# Patient Record
Sex: Female | Born: 1975
Health system: Southern US, Community
[De-identification: ages and names within clinical notes are randomized; demographics above are authoritative.]

## PROBLEM LIST (undated history)

## (undated) ENCOUNTER — Inpatient Hospital Stay (HOSPITAL_COMMUNITY): Payer: Self-pay

## (undated) DIAGNOSIS — A609 Anogenital herpesviral infection, unspecified: Secondary | ICD-10-CM

## (undated) DIAGNOSIS — F32A Depression, unspecified: Secondary | ICD-10-CM

## (undated) DIAGNOSIS — F329 Major depressive disorder, single episode, unspecified: Secondary | ICD-10-CM

## (undated) DIAGNOSIS — R87629 Unspecified abnormal cytological findings in specimens from vagina: Secondary | ICD-10-CM

## (undated) DIAGNOSIS — O09529 Supervision of elderly multigravida, unspecified trimester: Secondary | ICD-10-CM

## (undated) DIAGNOSIS — K900 Celiac disease: Secondary | ICD-10-CM

## (undated) DIAGNOSIS — K589 Irritable bowel syndrome without diarrhea: Secondary | ICD-10-CM

## (undated) HISTORY — PX: MOUTH SURGERY: SHX715

## (undated) HISTORY — PX: ADENOIDECTOMY: SUR15

## (undated) HISTORY — PX: HAND SURGERY: SHX662

---

## 2001-01-16 ENCOUNTER — Observation Stay (HOSPITAL_COMMUNITY): Admission: AD | Admit: 2001-01-16 | Discharge: 2001-01-17 | Payer: Self-pay | Admitting: Family Medicine

## 2001-01-30 ENCOUNTER — Other Ambulatory Visit: Admission: RE | Admit: 2001-01-30 | Discharge: 2001-01-30 | Payer: Self-pay | Admitting: Family Medicine

## 2001-01-30 ENCOUNTER — Other Ambulatory Visit: Admission: RE | Admit: 2001-01-30 | Discharge: 2001-01-30 | Payer: Self-pay | Admitting: Obstetrics & Gynecology

## 2001-07-13 ENCOUNTER — Inpatient Hospital Stay (HOSPITAL_COMMUNITY): Admission: AD | Admit: 2001-07-13 | Discharge: 2001-07-13 | Payer: Self-pay | Admitting: Obstetrics & Gynecology

## 2001-07-23 ENCOUNTER — Inpatient Hospital Stay (HOSPITAL_COMMUNITY): Admission: AD | Admit: 2001-07-23 | Discharge: 2001-07-26 | Payer: Self-pay | Admitting: Obstetrics and Gynecology

## 2001-08-23 ENCOUNTER — Other Ambulatory Visit: Admission: RE | Admit: 2001-08-23 | Discharge: 2001-08-23 | Payer: Self-pay | Admitting: Obstetrics & Gynecology

## 2003-06-13 ENCOUNTER — Other Ambulatory Visit: Admission: RE | Admit: 2003-06-13 | Discharge: 2003-06-13 | Payer: Self-pay | Admitting: Obstetrics & Gynecology

## 2005-03-03 ENCOUNTER — Other Ambulatory Visit: Admission: RE | Admit: 2005-03-03 | Discharge: 2005-03-03 | Payer: Self-pay | Admitting: Obstetrics and Gynecology

## 2005-03-03 ENCOUNTER — Other Ambulatory Visit: Admission: RE | Admit: 2005-03-03 | Discharge: 2005-03-03 | Payer: Self-pay | Admitting: Obstetrics & Gynecology

## 2005-04-19 HISTORY — PX: OTHER SURGICAL HISTORY: SHX169

## 2006-01-01 ENCOUNTER — Emergency Department (HOSPITAL_COMMUNITY): Admission: EM | Admit: 2006-01-01 | Discharge: 2006-01-01 | Payer: Self-pay | Admitting: Emergency Medicine

## 2007-04-26 ENCOUNTER — Encounter (INDEPENDENT_AMBULATORY_CARE_PROVIDER_SITE_OTHER): Payer: Self-pay | Admitting: Obstetrics & Gynecology

## 2007-04-26 ENCOUNTER — Ambulatory Visit (HOSPITAL_COMMUNITY): Admission: RE | Admit: 2007-04-26 | Discharge: 2007-04-26 | Payer: Self-pay | Admitting: Obstetrics & Gynecology

## 2009-11-06 ENCOUNTER — Observation Stay (HOSPITAL_COMMUNITY): Admission: EM | Admit: 2009-11-06 | Discharge: 2009-11-10 | Payer: Self-pay | Admitting: Emergency Medicine

## 2010-02-08 ENCOUNTER — Emergency Department (HOSPITAL_COMMUNITY): Admission: EM | Admit: 2010-02-08 | Discharge: 2010-02-08 | Payer: Self-pay | Admitting: Emergency Medicine

## 2010-07-04 LAB — FECAL LACTOFERRIN, QUANT: Fecal Lactoferrin: POSITIVE

## 2010-07-04 LAB — DIFFERENTIAL
Basophils Absolute: 0 10*3/uL (ref 0.0–0.1)
Basophils Absolute: 0 10*3/uL (ref 0.0–0.1)
Basophils Relative: 0 % (ref 0–1)
Basophils Relative: 0 % (ref 0–1)
Eosinophils Absolute: 0.1 10*3/uL (ref 0.0–0.7)
Eosinophils Absolute: 0.1 10*3/uL (ref 0.0–0.7)
Eosinophils Absolute: 0.1 10*3/uL (ref 0.0–0.7)
Eosinophils Relative: 2 % (ref 0–5)
Eosinophils Relative: 2 % (ref 0–5)
Eosinophils Relative: 2 % (ref 0–5)
Eosinophils Relative: 3 % (ref 0–5)
Lymphocytes Relative: 39 % (ref 12–46)
Lymphocytes Relative: 56 % — ABNORMAL HIGH (ref 12–46)
Lymphs Abs: 1.5 10*3/uL (ref 0.7–4.0)
Lymphs Abs: 2.3 10*3/uL (ref 0.7–4.0)
Monocytes Absolute: 0.2 10*3/uL (ref 0.1–1.0)
Monocytes Absolute: 0.4 10*3/uL (ref 0.1–1.0)
Monocytes Relative: 10 % (ref 3–12)
Monocytes Relative: 6 % (ref 3–12)
Monocytes Relative: 7 % (ref 3–12)
Neutro Abs: 1.3 10*3/uL — ABNORMAL LOW (ref 1.7–7.7)
Neutro Abs: 2 10*3/uL (ref 1.7–7.7)
Neutro Abs: 3.2 10*3/uL (ref 1.7–7.7)
Neutrophils Relative %: 31 % — ABNORMAL LOW (ref 43–77)
Neutrophils Relative %: 41 % — ABNORMAL LOW (ref 43–77)
Neutrophils Relative %: 47 % (ref 43–77)
Neutrophils Relative %: 52 % (ref 43–77)

## 2010-07-04 LAB — URINALYSIS, ROUTINE W REFLEX MICROSCOPIC
Glucose, UA: NEGATIVE mg/dL
Nitrite: NEGATIVE
Protein, ur: NEGATIVE mg/dL
Urobilinogen, UA: 0.2 mg/dL (ref 0.0–1.0)

## 2010-07-04 LAB — BASIC METABOLIC PANEL
BUN: 1 mg/dL — ABNORMAL LOW (ref 6–23)
BUN: 1 mg/dL — ABNORMAL LOW (ref 6–23)
CO2: 25 mEq/L (ref 19–32)
CO2: 26 mEq/L (ref 19–32)
CO2: 27 mEq/L (ref 19–32)
CO2: 27 mEq/L (ref 19–32)
Calcium: 8 mg/dL — ABNORMAL LOW (ref 8.4–10.5)
Calcium: 8.4 mg/dL (ref 8.4–10.5)
Calcium: 8.5 mg/dL (ref 8.4–10.5)
Chloride: 105 mEq/L (ref 96–112)
Chloride: 105 mEq/L (ref 96–112)
Chloride: 111 mEq/L (ref 96–112)
Creatinine, Ser: 0.76 mg/dL (ref 0.4–1.2)
Creatinine, Ser: 0.82 mg/dL (ref 0.4–1.2)
Creatinine, Ser: 0.82 mg/dL (ref 0.4–1.2)
GFR calc Af Amer: >60 mL/min (ref >60)
GFR calc Af Amer: >60 mL/min (ref >60)
GFR calc non Af Amer: >60 mL/min (ref >60)
GFR calc non Af Amer: >60 mL/min (ref >60)
GFR calc non Af Amer: >60 mL/min (ref >60)
Glucose, Bld: 66 mg/dL — ABNORMAL LOW (ref 70–99)
Glucose, Bld: 93 mg/dL (ref 70–99)
Glucose, Bld: 95 mg/dL (ref 70–99)
Potassium: 3.8 mEq/L (ref 3.5–5.1)
Potassium: 4.1 mEq/L (ref 3.5–5.1)
Potassium: 4.2 mEq/L (ref 3.5–5.1)
Sodium: 136 mEq/L (ref 135–145)
Sodium: 137 mEq/L (ref 135–145)
Sodium: 139 mEq/L (ref 135–145)

## 2010-07-04 LAB — CBC
HCT: 31.4 % — ABNORMAL LOW (ref 36.0–46.0)
HCT: 33.1 % — ABNORMAL LOW (ref 36.0–46.0)
HCT: 36.3 % (ref 36.0–46.0)
HCT: 41.7 % (ref 36.0–46.0)
Hemoglobin: 11.4 g/dL — ABNORMAL LOW (ref 12.0–15.0)
Hemoglobin: 12.7 g/dL (ref 12.0–15.0)
MCH: 33 pg (ref 26.0–34.0)
MCH: 33.4 pg (ref 26.0–34.0)
MCH: 33.6 pg (ref 26.0–34.0)
MCH: 33.7 pg (ref 26.0–34.0)
MCHC: 34.3 g/dL (ref 30.0–36.0)
MCHC: 34.6 g/dL (ref 30.0–36.0)
MCHC: 34.9 g/dL (ref 30.0–36.0)
MCV: 96.5 fL (ref 78.0–100.0)
MCV: 96.5 fL (ref 78.0–100.0)
MCV: 97.3 fL (ref 78.0–100.0)
Platelets: 171 10*3/uL (ref 150–400)
Platelets: 177 10*3/uL (ref 150–400)
Platelets: 178 10*3/uL (ref 150–400)
Platelets: 249 10*3/uL (ref 150–400)
RBC: 3.4 MIL/uL — ABNORMAL LOW (ref 3.87–5.11)
RBC: 3.76 MIL/uL — ABNORMAL LOW (ref 3.87–5.11)
RDW: 12.9 % (ref 11.5–15.5)
RDW: 13.3 % (ref 11.5–15.5)
RDW: 13.3 % (ref 11.5–15.5)
WBC: 3.9 10*3/uL — ABNORMAL LOW (ref 4.0–10.5)
WBC: 4.2 10*3/uL (ref 4.0–10.5)
WBC: 5.6 10*3/uL (ref 4.0–10.5)
WBC: 6.7 10*3/uL (ref 4.0–10.5)

## 2010-07-04 LAB — IRON AND TIBC
Iron: 69 ug/dL (ref 42–135)
Saturation Ratios: 25 % (ref 20–55)
TIBC: 274 ug/dL (ref 250–470)
UIBC: 205 ug/dL

## 2010-07-04 LAB — OVA AND PARASITE EXAMINATION

## 2010-07-04 LAB — HEPATIC FUNCTION PANEL
ALT: 11 U/L (ref 0–35)
Alkaline Phosphatase: 54 U/L (ref 39–117)
Total Bilirubin: 0.7 mg/dL (ref 0.3–1.2)

## 2010-07-04 LAB — CLOSTRIDIUM DIFFICILE EIA: C difficile Toxins A+B, EIA: NEGATIVE

## 2010-07-04 LAB — VITAMIN B12: Vitamin B-12: 376 pg/mL (ref 211–911)

## 2010-07-04 LAB — HEMOCCULT GUIAC POC 1CARD (OFFICE)
Fecal Occult Bld: NEGATIVE
Fecal Occult Bld: NEGATIVE
Fecal Occult Bld: NEGATIVE

## 2010-07-04 LAB — LIPASE, BLOOD: Lipase: 29 U/L (ref 11–59)

## 2010-07-04 LAB — FOLATE RBC: RBC Folate: 654 ng/mL — ABNORMAL HIGH (ref 180–600)

## 2010-09-01 NOTE — Op Note (Signed)
Tina Ramirez, Tina Ramirez                 ACCOUNT NO.:  [id number]   MEDICAL RECORD NO.:  [id number]          PATIENT TYPE:  AMB   LOCATION:  SDC                           FACILITY:  [facility]   PHYSICIAN:  Freddy Finner, M.D.   DATE OF BIRTH:  Dec 05, 1975   DATE OF PROCEDURE:  04/26/2007  DATE OF DISCHARGE:                               OPERATIVE REPORT   PREOPERATIVE DIAGNOSIS:  First trimester intrauterine pregnancy with  demise/missed abortion.   POSTOPERATIVE DIAGNOSIS:  First trimester intrauterine pregnancy with  demise/missed abortion.   OPERATIVE PROCEDURE:  Dilatation and evacuation.   SURGEON:  Freddy Finner, M.D.   ANESTHESIA:  IV sedation and paracervical block.   ESTIMATED INTRAOPERATIVE BLOOD LOSS:  Less than or equal to 50 mL.   INTRAOPERATIVE COMPLICATIONS:  None.   The patient is a 35 year old who presented approximately one week prior  to this admission with an early pregnancy.  On pregnancy confirmation  ultrasound, the fetal heart rate was noted to be in the 80-90 range with  some beginning degeneration of the gestational sac.  The patient was  brought back to the office approximately two days prior to this  admission, at which time repeat ultrasound did confirm a nonviable  fetus.  Gestational age by dates should be approximately 8-9 weeks.  The  gestational sac was 5 weeks.  She was admitted on the morning of  surgery.  Her blood type is known to be A positive.   She was brought to the operating room, placed under IV sedation, placed  in the dorsal lithotomy position using the Peterman stirrup system.  Betadine prep of the mons, perineum and vagina was carried out in  standard fashion.  A bivalve speculum was introduced into the vagina.  Cervix was grasped on the anterior cervical lip with a single-tooth  tenaculum.  Paracervical block was placed using a total of 10 mL of 1%  Xylocaine.  Injections were made at 4 and 8 o'clock in the vaginal  fornices.  Pratt  dilators were then used to dilate the cervix to #7 with  Pratts.  A 7-mm suction cannula was introduced and aspiration with  vacuum device of __________ products of conception.  This was continued  until the cavity was evacuated.  This was confirmed by gentle thorough  curettage of the endometrial cavity, exploration with the Randall stone  forceps and repeat vacuum aspiration.  Hemostasis at this point was  noted to be adequate.  Instruments were removed.  The patient was given  Toradol 30 mg IV and 30 mg IM.  She was  awakened and taken to recovery in good condition.  She will be given  routine outpatient surgical instructions.  She is to return to the  office in 1-2 weeks for postoperative follow-up.  She is given Darvocet  to be taken as needed for postoperative pain unrelieved by ibuprofen or  Tylenol.      Freddy Finner, M.D.  Electronically Signed     WRN/MEDQ  D:  04/26/2007  T:  04/27/2007  Job:  [id number]

## 2010-09-04 NOTE — Discharge Summary (Signed)
[facility]  Patient:    Tina Ramirez, Tina Ramirez Visit Number: [id number] MRN: [id number]          Service Type: OBS Location: 910A 9104 01 Attending Physician:  Trevor Iha Dictated by:   Danie Chandler, R.N. Admit Date:  07/23/2001 Discharge Date: 07/26/2001                             Discharge Summary  ADMISSION DIAGNOSES:          1. Intrauterine pregnancy at [redacted] weeks gestation                                  with rupture of membranes.                               2. Previous cesarean section with desired                                  repeat.  DISCHARGE DIAGNOSES:          1. Intrauterine pregnancy at [redacted] weeks gestation                                  with rupture of membranes.                               2. Previous cesarean section with desired                                  repeat.  PROCEDURE:                    On July 23, 2001 repeat low segment transverse                               cesarean section.  REASON FOR ADMISSION:         Please see dictated H&P.  HOSPITAL COURSE:              The patient was taken to the operating room and underwent the above noted procedure without complication.  This was productive of a viable female infant with Apgars of 8 at one minute and 9 at five minutes and an arterial cord pH of 7.32.  Postoperatively, on day one the patient was complaining of some itching and was taking Benadryl as needed.  Her hemoglobin on this day was 9.7, hematocrit 27.4 and white blood cell count 8.8.  On postoperative day two, she had a good return of bowel function and was tolerating a regular diet.  She also had good pain control and was ambulating well without difficulty.  She was discharged home on postoperative day three.  CONDITION ON DISCHARGE:       Good.  DIET:                         Regular as tolerated.  ACTIVITY:  No heavy lifting. No driving.  No vaginal entry.  FOLLOW-UP:                     She is to follow up in the office in one to two weeks for incision check.  She is to call for a temperature greater than 100 degrees, persistent nausea or vomiting, heavy vaginal bleeding and/or redness or drainage from the incision site.  DISCHARGE MEDICATIONS:        1. Prenatal vitamin one p.o. q.d.                               2. Percocet 5 mg, #30, one to two p.o.                                  q.6-8h. as needed. Dictated by:   Danie Chandler, R.N. Attending Physician:  Trevor Iha DD:  07/26/01 TD:  07/27/01 Job: [id number]

## 2010-09-04 NOTE — H&P (Signed)
[facility]  Patient:    Tina Ramirez, Tina Ramirez Visit Number: [id number] MRN: [id number]          Service Type: OBS Location: 910A 9104 01 Attending Physician:  Trevor Iha Dictated by:   Trevor Iha, M.D. Admit Date:  07/23/2001                           History and Physical  HISTORY OF PRESENT ILLNESS:   Ms. [name] is a 35 year old G2, P1, A1 with no living children, who presents at 36 weeks with rupture of membranes.  Her past history is significant for history of IUFD and cesarean section.  She desires repeat cesarean section.  Her estimated date of confinement is Aug 20, 2001.  Pregnancy otherwise has been uncomplicated.  PAST MEDICAL HISTORY:         1. Depression.                               2. Preterm labor.  PAST SURGICAL HISTORY:        1. Cesarean section.                               2. Hand surgery.  MEDICATIONS:                  Prenatal vitamins, Prozac, terbutaline, Ambien.  ALLERGIES:                    CODEINE.  PHYSICAL EXAMINATION:  VITAL SIGNS:                  Blood pressure 100/58.  HEART:                        Regular rate and rhythm.  LUNGS:                        Clear to auscultation bilaterally.  ABDOMEN:                      Gravid, nontender.  PELVIC:                       Gross rupture noted by nurse.  IMPRESSION AND PLAN:          Intrauterine pregnancy at 36 weeks, with rupture of membranes.  Previous cesarean section, desires repeat.  Plan to repeat low segment cesarean section.  Risks and benefits were discussed at length. Informed consent was obtained. Dictated by:   Trevor Iha, M.D. Attending Physician:  Trevor Iha DD:  07/23/01 TD:  07/23/01 Job: [id number]

## 2010-09-04 NOTE — Op Note (Signed)
[facility]  Patient:    Tina Ramirez, Tina Ramirez Visit Number: [id number] MRN: [id number]          Service Type: OBS Location: 910A 9104 01 Attending Physician:  Trevor Iha Dictated by:   Trevor Iha, M.D. Proc. Date: 07/23/01 Admit Date:  07/23/2001                             Operative Report  PREOPERATIVE DIAGNOSES:       Intrauterine pregnancy at 36 weeks, rupture of membranes, previous cesarean section with desired repeat.  POSTOPERATIVE DIAGNOSES:      Intrauterine pregnancy at 36 weeks, rupture of membranes, previous cesarean section with desired repeat.  PROCEDURE:                    Repeat low segment transverse cesarean section.  SURGEON:                      Trevor Iha, M.D.  ANESTHESIA:                   Spinal.  INDICATIONS:                  Ms. [name] is a 35 year old G2, P1 at 56 weeks who presents with ruptured membranes in early labor.  Previous cesarean section with desired repeat.  Risks and benefits were discussed at length. Informed consent obtained.  FINDINGS:                     Viable female infant.  Apgars 8 and 9.  Arterial pH 7.32.  Weight 6 pounds 6 ounces.  DESCRIPTION OF PROCEDURE:     After adequate analgesia with the patient placed in the supine position with left lateral tilt, she is sterilely prepped and draped.  The Foley catheter was sterilely placed.  The previous Pfannenstiel skin incision was sharply excised from the skin.  It was taken down sharply to the fascia which was incised transversely and extended superiorly and inferiorly off the ______ of the rectus muscles which were separated sharply in the midline.  Peritoneum was entered sharply.  Bladder blade was placed. Uterine serosa was elevated, nicked, incised transversely and bladder flap was created and placed behind the bladder blade.  A low segment myotomy incision was made down to the infants vertex which was delivered  atraumatically. Nares and pharynx suctioned and the infant was then delivered.  Cord clamped, cut, and handed to the pediatricians for resuscitation.  Cord blood was obtained.  Placenta extracted manually.  The uterus was exteriorized, wiped clean with a dry lap.  Myotomy incision was closed in two layers, the first being a running locking layer, the second being an embrocating layer of 0 Monocryl suture.  After adequate hemostasis was assured, the uterus placed back into the peritoneal cavity.  After copious amount of irrigation and adequate hemostasis, the peritoneum is closed with 0 Monocryl.  Rectus muscle plicated in the midline.  Irrigation once again applied and after adequate hemostasis the fascia was closed in a running suture of #1 Vicryl.  Irrigation once again applied and after adequate hemostasis skin was stapled, Steri-Strips applied.  Patient tolerated procedure well.  Was stable on transfer to the recovery room.  Again, sponge, instrument, needle count was normal x3.  The patient received 1 g of Cefotetan after delivery of the placenta. Dictated by:  Trevor Iha, M.D. Attending Physician:  Trevor Iha DD:  07/23/01 TD:  07/24/01 Job: [id number]

## 2010-12-07 ENCOUNTER — Encounter: Payer: Self-pay | Admitting: *Deleted

## 2010-12-07 ENCOUNTER — Emergency Department (HOSPITAL_COMMUNITY)
Admission: EM | Admit: 2010-12-07 | Discharge: 2010-12-07 | Disposition: A | Discharge status: Home / Self Care | Payer: BC Managed Care – PPO | Attending: Emergency Medicine | Admitting: Emergency Medicine

## 2010-12-07 DIAGNOSIS — R197 Diarrhea, unspecified: Secondary | ICD-10-CM | POA: Insufficient documentation

## 2010-12-07 DIAGNOSIS — R112 Nausea with vomiting, unspecified: Secondary | ICD-10-CM | POA: Insufficient documentation

## 2010-12-07 DIAGNOSIS — R1084 Generalized abdominal pain: Secondary | ICD-10-CM

## 2010-12-07 DIAGNOSIS — R5381 Other malaise: Secondary | ICD-10-CM | POA: Insufficient documentation

## 2010-12-07 DIAGNOSIS — R109 Unspecified abdominal pain: Secondary | ICD-10-CM | POA: Insufficient documentation

## 2010-12-07 DIAGNOSIS — R5383 Other fatigue: Secondary | ICD-10-CM | POA: Insufficient documentation

## 2010-12-07 DIAGNOSIS — R630 Anorexia: Secondary | ICD-10-CM | POA: Insufficient documentation

## 2010-12-07 HISTORY — DX: Celiac disease: K90.0

## 2010-12-07 LAB — CBC
Hemoglobin: 13 g/dL (ref 12.0–15.0)
RBC: 3.99 MIL/uL (ref 3.87–5.11)
WBC: 4.6 10*3/uL (ref 4.0–10.5)

## 2010-12-07 LAB — DIFFERENTIAL
Basophils Relative: 0 % (ref 0–1)
Lymphs Abs: 2.2 10*3/uL (ref 0.7–4.0)
Monocytes Relative: 6 % (ref 3–12)
Neutro Abs: 2 10*3/uL (ref 1.7–7.7)
Neutrophils Relative %: 44 % (ref 43–77)

## 2010-12-07 LAB — COMPREHENSIVE METABOLIC PANEL
BUN: 6 mg/dL (ref 6–23)
CO2: 29 mEq/L (ref 19–32)
Calcium: 10 mg/dL (ref 8.4–10.5)
Creatinine, Ser: 0.61 mg/dL (ref 0.50–1.10)
GFR calc Af Amer: >60 mL/min (ref >60)
GFR calc non Af Amer: >60 mL/min (ref >60)
Glucose, Bld: 94 mg/dL (ref 70–99)

## 2010-12-07 LAB — URINALYSIS, ROUTINE W REFLEX MICROSCOPIC
Glucose, UA: NEGATIVE mg/dL
Leukocytes, UA: NEGATIVE
Nitrite: NEGATIVE
Protein, ur: NEGATIVE mg/dL
pH: 6.5 (ref 5.0–8.0)

## 2010-12-07 LAB — PREGNANCY, URINE: Preg Test, Ur: NEGATIVE

## 2010-12-07 LAB — LIPASE, BLOOD: Lipase: 22 U/L (ref 11–59)

## 2010-12-07 MED ORDER — ONDANSETRON HCL 4 MG/2ML IJ SOLN
4.0000 mg | Freq: Once | INTRAMUSCULAR | Status: AC
  Administered 2010-12-07: 4 mg via INTRAVENOUS
  Filled 2010-12-07: qty 2

## 2010-12-07 MED ORDER — SODIUM CHLORIDE 0.9 % IV SOLN
Freq: Once | INTRAVENOUS | Status: AC
  Administered 2010-12-07: 18:00:00 via INTRAVENOUS

## 2010-12-07 MED ORDER — HYDROMORPHONE HCL 1 MG/ML IJ SOLN
0.5000 mg | Freq: Once | INTRAMUSCULAR | Status: AC
  Administered 2010-12-07: 0.5 mg via INTRAVENOUS
  Filled 2010-12-07: qty 1

## 2010-12-07 MED ORDER — SODIUM CHLORIDE 0.9 % IV BOLUS (SEPSIS): 1000.0000 mL | Freq: Once | INTRAVENOUS | Status: DC

## 2010-12-07 MED ORDER — SODIUM CHLORIDE 0.9 % IV BOLUS (SEPSIS)
1000.0000 mL | Freq: Once | INTRAVENOUS | Status: AC
  Administered 2010-12-07: 1000 mL via INTRAVENOUS

## 2010-12-07 NOTE — ED Notes (Signed)
Generalized abd pain x 1 1/2 wks with nausea.  Seen PCP last week - states was dx with Celiac Disease.  Called PCP this morning due to pain and nausea and was told to come here for dehydration and decreased appetite.

## 2010-12-07 NOTE — ED Provider Notes (Addendum)
History   Scribed for No att. providers found, the patient was seen in room APA11/APA11. This chart was scribed by Halina Maidens. This patient's care was started at 16:38.   CSN: [id number] Arrival date & time: 12/07/2010  4:15 PM  Chief Complaint  Patient presents with  . Abdominal Pain   HPI  Tina Ramirez is a 35 y.o. female who presents to the Emergency Department complaining of abdominal pain starting about 2 weeks ago with associated symptoms of nausea, vomiting, diarrhea, loss of appetite, generalized weakness and fatigue. Patient denies having any blood in her urine, vomit, feces and denies dysuria. The diarrhea was described as watery and without blood. Patient's fever lasted about 5 days, and resolved one week ago. She was given an antiemetic by her PCP. Patient has a history of Celiac disease that is newly diagnosed, though her symptoms have been chronic for some time. She has had bacterial infections in the small intestine, and 2 C-sections. Patient's LNMP was about one week ago.     HPI ELEMENTS: Location: Abdomen Onset: 2 weeks ago Duration: Persistent since onset.  Context: as above  Associated symptoms: as above    PAST MEDICAL HISTORY:  Past Medical History  Diagnosis Date  . Celiac disease     PAST SURGICAL HISTORY:  Past Surgical History  Procedure Date  . Cesarean section     x 2  . Hand surgery     right x 2     MEDICATIONS:  Previous Medications   ACETAMINOPHEN (TYLENOL) 500 MG TABLET    Take 500 mg by mouth as needed. FOR PAIN    DICYCLOMINE (BENTYL) 10 MG CAPSULE    Take 10 mg by mouth 3 (three) times daily as needed. FOR CRAMPS    ETONOGESTREL-ETHINYL ESTRADIOL (NUVARING) 0.12-0.015 MG/24HR VAGINAL RING    Place 1 each vaginally every 28 (twenty-eight) days. Insert vaginally and leave in place for 3 consecutive weeks, then remove for 1 week.    FLUOXETINE (PROZAC) 40 MG CAPSULE    Take 40 mg by mouth daily.     ONDANSETRON (ZOFRAN) 8 MG TABLET     Take 8 mg by mouth 3 (three) times daily as needed.       ALLERGIES:  Allergies as of 12/07/2010  . (No Known Allergies)     FAMILY HISTORY:  No family history on file.   SOCIAL HISTORY: History   Social History  . Marital Status: Divorced    Spouse Name: N/A    Number of Children: N/A  . Years of Education: N/A   Social History Main Topics  . Smoking status: Never Smoker   . Smokeless tobacco: None  . Alcohol Use: Yes     occasional  . Drug Use: No  . Sexually Active: Yes    Birth Control/ Protection: Inserts   Other Topics Concern  . None   Social History Narrative  . None     Review of Systems  10 Systems reviewed and are negative for acute change except as noted in the HPI.  Physical Exam  BP 117/85  Pulse 75  Temp(Src) 98.4 F (36.9 C) (Oral)  Resp 17  Ht 5\' 4"  (1.626 m)  Wt 106 lb 3.2 oz (48.172 kg)  BMI 18.23 kg/m2  SpO2 100%  LMP 11/30/2010  Physical Exam  Nursing note and vitals reviewed. Constitutional: She is oriented to person, place, and time. She appears well-developed and well-nourished.  HENT:  Head: Normocephalic and atraumatic.  Eyes:  EOM are normal.  Neck: Neck supple.  Cardiovascular: Normal rate, regular rhythm and normal heart sounds.  Exam reveals no gallop and no friction rub.   No murmur heard. Pulmonary/Chest: Effort normal and breath sounds normal. She has no wheezes.  Abdominal: Soft. Bowel sounds are normal. She exhibits no distension. There is no tenderness. There is no rebound and no guarding.  Musculoskeletal: Normal range of motion. She exhibits no edema.  Neurological: She is alert and oriented to person, place, and time. No sensory deficit.  Skin: Skin is warm and dry.  Psychiatric: She has a normal mood and affect. Her behavior is normal.    ED Course  Procedures  MDM OTHER DATA REVIEWED: Nursing notes, vital signs, and past medical records reviewed. Lab results reviewed and considered Imaging results  reviewed and considered  DIAGNOSTIC STUDIES: Oxygen Saturation is 100% on room air, normal by my interpretation.    LABS / RADIOLOGY: Results for orders placed during the hospital encounter of 12/07/10  COMPREHENSIVE METABOLIC PANEL      Component Value Range   Sodium 137  135 - 145 (mEq/L)   Potassium 3.8  3.5 - 5.1 (mEq/L)   Chloride 101  96 - 112 (mEq/L)   CO2 29  19 - 32 (mEq/L)   Glucose, Bld 94  70 - 99 (mg/dL)   BUN 6  6 - 23 (mg/dL)   Creatinine, Ser 1.61  0.50 - 1.10 (mg/dL)   Calcium 09.6  8.4 - 10.5 (mg/dL)   Total Protein 7.2  6.0 - 8.3 (g/dL)   Albumin 4.0  3.5 - 5.2 (g/dL)   AST 15  0 - 37 (U/L)   ALT 10  0 - 35 (U/L)   Alkaline Phosphatase 49  39 - 117 (U/L)   Total Bilirubin 0.5  0.3 - 1.2 (mg/dL)   GFR calc non Af Amer >60  >60 (mL/min)   GFR calc Af Amer >60  >60 (mL/min)  CBC      Component Value Range   WBC 4.6  4.0 - 10.5 (K/uL)   RBC 3.99  3.87 - 5.11 (MIL/uL)   Hemoglobin 13.0  12.0 - 15.0 (g/dL)   HCT 04.5  40.9 - 81.1 (%)   MCV 93.0  78.0 - 100.0 (fL)   MCH 32.6  26.0 - 34.0 (pg)   MCHC 35.0  30.0 - 36.0 (g/dL)   RDW 91.4  78.2 - 95.6 (%)   Platelets 202  150 - 400 (K/uL)  DIFFERENTIAL      Component Value Range   Neutrophils Relative 44  43 - 77 (%)   Neutro Abs 2.0  1.7 - 7.7 (K/uL)   Lymphocytes Relative 47 (*) 12 - 46 (%)   Lymphs Abs 2.2  0.7 - 4.0 (K/uL)   Monocytes Relative 6  3 - 12 (%)   Monocytes Absolute 0.3  0.1 - 1.0 (K/uL)   Eosinophils Relative 3  0 - 5 (%)   Eosinophils Absolute 0.1  0.0 - 0.7 (K/uL)   Basophils Relative 0  0 - 1 (%)   Basophils Absolute 0.0  0.0 - 0.1 (K/uL)  LIPASE, BLOOD      Component Value Range   Lipase 22  11 - 59 (U/L)  URINALYSIS, ROUTINE W REFLEX MICROSCOPIC      Component Value Range   Color, Urine YELLOW  YELLOW    Appearance CLEAR  CLEAR    Specific Gravity, Urine 1.015  1.005 - 1.030    pH 6.5  5.0 - 8.0    Glucose, UA NEGATIVE  NEGATIVE (mg/dL)   Hgb urine dipstick NEGATIVE  NEGATIVE     Bilirubin Urine NEGATIVE  NEGATIVE    Ketones, ur NEGATIVE  NEGATIVE (mg/dL)   Protein, ur NEGATIVE  NEGATIVE (mg/dL)   Urobilinogen, UA 0.2  0.0 - 1.0 (mg/dL)   Nitrite NEGATIVE  NEGATIVE    Leukocytes, UA NEGATIVE  NEGATIVE   PREGNANCY, URINE      Component Value Range   Preg Test, Ur NEGATIVE      ED COURSE / COORDINATION OF CARE: Orders Placed This Encounter  Procedures  . Comprehensive metabolic panel  . CBC  . Differential  . Lipase, blood  . Urinalysis with microscopic  . Pregnancy, urine     MDM: 11:09 PM Pt given meds and iv fluids, feels better, repeat abd exam benign, will f/u her pcp The patient is to return the emergency department if there is any worsening of symptoms. I have reviewed the discharge instructions with the patient/family  CONDITION ON DISCHARGE: Stable  MEDICATIONS GIVEN IN THE E.D.  Medications  ondansetron (ZOFRAN) injection 4 mg (4 mg Intravenous Given 12/07/10 1748)  0.9 %  sodium chloride infusion (  Intravenous New Bag 12/07/10 1745)  HYDROmorphone (DILAUDID) injection 0.5 mg (0.5 mg Intravenous Given 12/07/10 1749)  sodium chloride 0.9 % bolus 1,000 mL (1000 mL Intravenous Given 12/07/10 1745)      I personally performed the services described in this documentation, which was scribed in my presence. The recorded information has been reviewed and considered. Toy Baker, MD       Toy Baker, MD 12/07/10 1849  Toy Baker, MD 12/07/10 [phone]

## 2011-01-06 LAB — CBC
Hemoglobin: 13.6
RDW: 13

## 2011-02-06 ENCOUNTER — Encounter (HOSPITAL_COMMUNITY): Payer: Self-pay | Admitting: Emergency Medicine

## 2011-02-06 ENCOUNTER — Emergency Department (HOSPITAL_COMMUNITY)
Admission: EM | Admit: 2011-02-06 | Discharge: 2011-02-06 | Disposition: A | Discharge status: Home / Self Care | Payer: BC Managed Care – PPO | Attending: Emergency Medicine | Admitting: Emergency Medicine

## 2011-02-06 DIAGNOSIS — J069 Acute upper respiratory infection, unspecified: Secondary | ICD-10-CM

## 2011-02-06 DIAGNOSIS — J029 Acute pharyngitis, unspecified: Secondary | ICD-10-CM | POA: Insufficient documentation

## 2011-02-06 LAB — RAPID STREP SCREEN (MED CTR MEBANE ONLY): Streptococcus, Group A Screen (Direct): NEGATIVE

## 2011-02-06 MED ORDER — CETIRIZINE-PSEUDOEPHEDRINE ER 5-120 MG PO TB12: 1.0000 | ORAL_TABLET | Freq: Two times a day (BID) | ORAL | Status: DC

## 2011-02-06 MED ORDER — IBUPROFEN 600 MG PO TABS: 600.0000 mg | ORAL_TABLET | Freq: Three times a day (TID) | ORAL | Status: AC | PRN

## 2011-02-06 MED ORDER — HYDROCODONE-ACETAMINOPHEN 5-325 MG PO TABS: 2.0000 | ORAL_TABLET | ORAL | Status: AC | PRN

## 2011-02-06 NOTE — ED Provider Notes (Addendum)
History   This chart was scribed for Felisa Bonier, MD by Clarita Crane. The patient was seen in room APA04/APA04 and the patient's care was started at 12:10PM.   CSN: [id number] Arrival date & time: 02/06/2011 11:32 AM   First MD Initiated Contact with Patient 02/06/11 1153      Chief Complaint  Patient presents with  . Sore Throat  . Cough  . Headache   HPI Tina Ramirez is a 35 y.o. female who presents to the Emergency Department complaining of constant sore throat onset 2 days ago and worsening since with associated non-productive cough and HA. Denies fever, nasal congestion, recent sick contacts. Reports sore throat is aggravated with coughing and relieved by nothing.    Past Medical History  Diagnosis Date  . Celiac disease     Past Surgical History  Procedure Date  . Cesarean section     x 2  . Hand surgery     right x 2     History reviewed. No pertinent family history.  History  Substance Use Topics  . Smoking status: Never Smoker   . Smokeless tobacco: Not on file  . Alcohol Use: Yes     occasional    OB History    Grav Para Term Preterm Abortions TAB SAB Ect Mult Living                  Review of Systems 10 Systems reviewed and are negative for acute change except as noted in the HPI.  Allergies  Gluten  Home Medications   Current Outpatient Rx  Name Route Sig Dispense Refill  . ACETAMINOPHEN 500 MG PO TABS Oral Take 1,000 mg by mouth every 6 (six) hours as needed. FOR PAIN    . ETONOGESTREL-ETHINYL ESTRADIOL 0.12-0.015 MG/24HR VA RING Vaginal Place 1 each vaginally every 28 (twenty-eight) days. Insert vaginally and leave in place for 3 consecutive weeks, then remove for 1 week.    Marland Kitchen DICYCLOMINE HCL 10 MG PO CAPS Oral Take 10 mg by mouth 3 (three) times daily as needed. FOR CRAMPS     . FLUOXETINE HCL 40 MG PO CAPS Oral Take 40 mg by mouth daily.      Marland Kitchen ONDANSETRON HCL 8 MG PO TABS Oral Take 8 mg by mouth 3 (three) times daily as needed.          BP 105/70  Pulse 71  Temp(Src) 98.2 F (36.8 C) (Oral)  Resp 18  Ht 5\' 1"  (1.549 m)  Wt 105 lb (47.628 kg)  BMI 19.84 kg/m2  SpO2 99%  LMP 01/30/2011  Physical Exam  Nursing note and vitals reviewed. Constitutional: She is oriented to person, place, and time. She appears well-developed and well-nourished. No distress.  HENT:  Head: Normocephalic and atraumatic.  Right Ear: Tympanic membrane normal.  Left Ear: Tympanic membrane normal.       Mild erythema of posterior oropharynx. No tonsilar swelling.   Eyes: EOM are normal. Pupils are equal, round, and reactive to light.  Neck: Neck supple. No tracheal deviation present.  Cardiovascular: Normal rate and regular rhythm.  Exam reveals no gallop and no friction rub.   No murmur heard. Pulmonary/Chest: Effort normal and breath sounds normal. No respiratory distress. She has no wheezes. She has no rales.  Abdominal: She exhibits no distension.  Musculoskeletal: Normal range of motion. She exhibits no edema.  Lymphadenopathy:    She has cervical adenopathy.  Neurological: She is alert and oriented to person,  place, and time. No sensory deficit.  Skin: Skin is warm and dry.  Psychiatric: She has a normal mood and affect. Her behavior is normal.    ED Course  Procedures (including critical care time)  DIAGNOSTIC STUDIES: Oxygen Saturation is 99% on room air, normal by my interpretation.    COORDINATION OF CARE:     Labs Reviewed  RAPID STREP SCREEN   No results found.   No diagnosis found.    MDM  Viral upper respiratory tract infection with cough, viral pharyngitis, bacterial pharyngitis. Bronchitis and pneumonia are not suspected based on history and physical examination. No chest x-ray appears to be warranted. Strep test is negative and so it appears to be a viral upper respiratory tract infection and viral pharyngitis and I will treat symptomatically.   I personally performed the services described in this  documentation, which was scribed in my presence. The recorded information has been reviewed and considered.    Felisa Bonier, MD 02/06/11 1320  Felisa Bonier, MD 02/06/11 1330

## 2011-02-06 NOTE — ED Notes (Signed)
Pt c/o sore throat with cough and headache x couple of days.

## 2011-08-23 ENCOUNTER — Emergency Department (HOSPITAL_COMMUNITY): Payer: BC Managed Care – PPO

## 2011-08-23 ENCOUNTER — Encounter (HOSPITAL_COMMUNITY): Payer: Self-pay | Admitting: Emergency Medicine

## 2011-08-23 ENCOUNTER — Emergency Department (HOSPITAL_COMMUNITY)
Admission: EM | Admit: 2011-08-23 | Discharge: 2011-08-23 | Disposition: A | Discharge status: Home / Self Care | Payer: BC Managed Care – PPO | Attending: Emergency Medicine | Admitting: Emergency Medicine

## 2011-08-23 DIAGNOSIS — Z79899 Other long term (current) drug therapy: Secondary | ICD-10-CM | POA: Insufficient documentation

## 2011-08-23 DIAGNOSIS — G43909 Migraine, unspecified, not intractable, without status migrainosus: Secondary | ICD-10-CM | POA: Insufficient documentation

## 2011-08-23 DIAGNOSIS — R112 Nausea with vomiting, unspecified: Secondary | ICD-10-CM | POA: Insufficient documentation

## 2011-08-23 MED ORDER — SODIUM CHLORIDE 0.9 % IV BOLUS (SEPSIS)
1000.0000 mL | Freq: Once | INTRAVENOUS | Status: AC
  Administered 2011-08-23: 1000 mL via INTRAVENOUS

## 2011-08-23 MED ORDER — DROPERIDOL 2.5 MG/ML IJ SOLN
1.2500 mg | Freq: Once | INTRAMUSCULAR | Status: AC
  Administered 2011-08-23: 1.25 mg via INTRAVENOUS
  Filled 2011-08-23: qty 2

## 2011-08-23 MED ORDER — KETOROLAC TROMETHAMINE 30 MG/ML IJ SOLN
30.0000 mg | Freq: Once | INTRAMUSCULAR | Status: AC
  Administered 2011-08-23: 30 mg via INTRAVENOUS
  Filled 2011-08-23: qty 1

## 2011-08-23 MED ORDER — PROMETHAZINE HCL 25 MG RE SUPP: RECTAL | Status: DC

## 2011-08-23 MED ORDER — DIPHENHYDRAMINE HCL 50 MG/ML IJ SOLN
25.0000 mg | Freq: Once | INTRAMUSCULAR | Status: AC
  Administered 2011-08-23: 25 mg via INTRAVENOUS
  Filled 2011-08-23: qty 1

## 2011-08-23 MED ORDER — MAGNESIUM SULFATE 40 MG/ML IJ SOLN
2.0000 g | Freq: Once | INTRAMUSCULAR | Status: AC
  Administered 2011-08-23: 2 g via INTRAVENOUS
  Filled 2011-08-23: qty 50

## 2011-08-23 MED ORDER — METOCLOPRAMIDE HCL 5 MG/ML IJ SOLN
10.0000 mg | Freq: Once | INTRAMUSCULAR | Status: AC
  Administered 2011-08-23: 10 mg via INTRAVENOUS
  Filled 2011-08-23: qty 2

## 2011-08-23 MED ORDER — VALPROATE SODIUM 500 MG/5ML IV SOLN
1.0000 g | Freq: Once | INTRAVENOUS | Status: AC
  Administered 2011-08-23: 1000 mg via INTRAVENOUS
  Filled 2011-08-23: qty 10

## 2011-08-23 MED ORDER — SODIUM CHLORIDE 0.9 % IV SOLN
INTRAVENOUS | Status: DC
  Administered 2011-08-23: 09:00:00 via INTRAVENOUS

## 2011-08-23 NOTE — Discharge Instructions (Signed)
Go home and rest. Drink plenty of fluids. Use the phenergan for nausea, vomiting or headache. Recheck as needed.   Migraine Headache A migraine is very bad pain on one or both sides of your head. The cause of a migraine is not always known. A migraine can be triggered or caused by different things, such as:  Alcohol.   Smoking.   Stress.   Periods (menstruation) in women.   Aged cheeses.   Foods or drinks that contain nitrates, glutamate, aspartame, or tyramine.   Lack of sleep.   Chocolate.   Caffeine.   Hunger.   Medicines, such as nitroglycerine (used to treat chest pain), birth control pills, estrogen, and some blood pressure medicines.  HOME CARE  Many medicines can help migraine pain or keep migraines from coming back. Your doctor can help you decide on a medicine or treatment program.   If you or your child gets a migraine, it may help to lie down in a dark, quiet room.   Keep a headache journal. This may help find out what is causing the headaches. For example, write down:   What you eat and drink.   How much sleep you get.   Any change to your diet or medicines.  GET HELP RIGHT AWAY IF:   The medicine does not work.   The pain begins again.   The neck is stiff.   You have trouble seeing.   The muscles are weak or you lose muscle control.   You have new symptoms.   You lose your balance.   You have trouble walking.   You feel faint or pass out.  MAKE SURE YOU:   Understand these instructions.   Will watch this condition.   Will get help right away if you are not doing well or get worse.  Document Released: 01/13/2008 Document Revised: 03/25/2011 Document Reviewed: 12/09/2008 [facility] Patient Information 2012 [place], [place].

## 2011-08-23 NOTE — ED Provider Notes (Cosign Needed)
History    This chart was scribed for Ward Givens, MD, MD by Smitty Pluck. The patient was seen in room APA12 and the patient's care was started at 7:35AM.   CSN: [id number]  Arrival date & time 08/23/11  0705   None     Chief Complaint  Patient presents with  . Headache    (Consider location/radiation/quality/duration/timing/severity/associated sxs/prior treatment) The history is provided by the patient.   Tina Ramirez is a 36 y.o. female who presents to the Emergency Department complaining of intermittent moderate headache with gradual onset 7 days ago worsening 1 day ago. The headache has been holocranial since onset and was initially off and on but has been more constant the past 24 hours. She states that she was unable to sleep last night due to headache. Pt reports that her entire head is hurting. The pain is described as "throbbing." Pt has photophobia and sound aggravates the pain. Denies sore throat, fever,rhinorrhea, diarrhea, and cough. She denies hx of headaches. Reports nausea and vomiting. Pt denies taking medication.  Denies numbness and tingling in arms or legs.    PCP is Dr. Lilyan Punt  Past Medical History  Diagnosis Date  . Celiac disease     Past Surgical History  Procedure Date  . Cesarean section     x 2  . Hand surgery     right x 2     No family history on file. MOP headaches  History  Substance Use Topics  . Smoking status: Never Smoker   . Smokeless tobacco: Not on file  . Alcohol Use: Yes     occasional  employed Pt denies using nuvaring anymore  OB History    Grav Para Term Preterm Abortions TAB SAB Ect Mult Living                  Review of Systems  All other systems reviewed and are negative.   10 Systems reviewed and all are negative for acute change except as noted in the HPI.   Allergies  Gluten  Home Medications   Current Outpatient Rx  Name Route Sig Dispense Refill  . ACETAMINOPHEN 500 MG PO TABS Oral Take 1,000  mg by mouth every 6 (six) hours as needed. FOR PAIN    . CETIRIZINE-PSEUDOEPHEDRINE ER 5-120 MG PO TB12 Oral Take 1 tablet by mouth 2 (two) times daily. 12 tablet 0  . DICYCLOMINE HCL 10 MG PO CAPS Oral Take 10 mg by mouth 3 (three) times daily as needed. FOR CRAMPS     . ETONOGESTREL-ETHINYL ESTRADIOL 0.12-0.015 MG/24HR VA RING Vaginal Place 1 each vaginally every 28 (twenty-eight) days. Insert vaginally and leave in place for 3 consecutive weeks, then remove for 1 week.    Marland Kitchen FLUOXETINE HCL 40 MG PO CAPS Oral Take 40 mg by mouth daily.      Marland Kitchen ONDANSETRON HCL 8 MG PO TABS Oral Take 8 mg by mouth 3 (three) times daily as needed.        BP 124/96  Pulse 98  Temp 98.7 F (37.1 C)  Resp 20  Ht 5\' 1"  (1.549 m)  Wt 105 lb (47.628 kg)  BMI 19.84 kg/m2  SpO2 98%  LMP 08/23/2011  Vital signs normal    Physical Exam  Nursing note and vitals reviewed. Constitutional: She is oriented to person, place, and time. She appears well-developed and well-nourished. No distress.       Appeared to be uncomfortable.   HENT:  Head: Normocephalic and atraumatic.  Right Ear: External ear normal.  Left Ear: External ear normal.  Mouth/Throat: Oropharynx is clear and moist.  Eyes: Conjunctivae are normal. Pupils are equal, round, and reactive to light.       Appeared to have photophobia.   Neck: Normal range of motion. Neck supple. No thyromegaly present.  Cardiovascular: Normal rate, regular rhythm and normal heart sounds.   Pulmonary/Chest: Effort normal. No respiratory distress.  Neurological: She is alert and oriented to person, place, and time.  Skin: Skin is warm and dry.  Psychiatric: She has a normal mood and affect. Her behavior is normal.    ED Course  Procedures (including critical care time)   Medications  0.9 %  sodium chloride infusion (  Intravenous New Bag/Given 08/23/11 0859)  valproate (DEPACON) 1,000 mg in dextrose 5 % 50 mL IVPB (1000 mg Intravenous Given 08/23/11 1139)    promethazine (PHENERGAN) 25 MG suppository (not administered)  sodium chloride 0.9 % bolus 1,000 mL (1000 mL Intravenous Given 08/23/11 0749)  metoCLOPramide (REGLAN) injection 10 mg (10 mg Intravenous Given 08/23/11 0750)  diphenhydrAMINE (BENADRYL) injection 25 mg (25 mg Intravenous Given 08/23/11 0750)  ketorolac (TORADOL) 30 MG/ML injection 30 mg (30 mg Intravenous Given 08/23/11 0934)  droperidol (INAPSINE) injection 1.25 mg (1.25 mg Intravenous Given 08/23/11 0935)  magnesium sulfate IVPB 2 g 50 mL (2 g Intravenous Given 08/23/11 1039)       DIAGNOSTIC STUDIES: Oxygen Saturation is 98% on room air, normal by my interpretation.    COORDINATION OF CARE: 7:40AM EDP discusses pt ED treatment course with pt.  7:40AM EDP ordered medication: 0.9% NaCl infusion, Reglan 10 mg, benadryl 25 mg 9:19AM EDP rechecks pt. Pt still has headache. EDP discusses CT. EDP is ordering more medication.  10:16AM EDP rechecks pt. Pt still has headache after toradol and inapsine. Order more medication. 11:40 has received magnesium and depacon, no longer holding her hands over her eyes and states she feels better.    Labs Reviewed - No data to display Ct Head Wo Contrast  08/23/2011  *RADIOLOGY REPORT*  Clinical Data: Headache  CT HEAD WITHOUT CONTRAST  Technique:  Contiguous axial images were obtained from the base of the skull through the vertex without contrast.  Comparison: None.  Findings: No skull fracture is noted.  Paranasal sinuses and mastoid air cells are unremarkable.  No intracranial hemorrhage, mass effect or midline shift.  No acute infarction.  No mass lesion is noted on this unenhanced scan.  No intra or extra- axial fluid collection.  No hydrocephalus.  The gray and white matter differentiation is preserved.  IMPRESSION: No acute intracranial abnormality.  Original Report Authenticated By: Natasha Mead, M.D.    Date: 08/23/2011  Rate: 69  Rhythm: normal sinus rhythm  QRS Axis: normal  Intervals:  normal  ST/T Wave abnormalities: normal  Conduction Disutrbances:none  Narrative Interpretation: PRWP, low voltage EKG, no prolonged QTIc  Old EKG Reviewed: none available    1. Migraine headache     New Prescriptions   PROMETHAZINE (PHENERGAN) 25 MG SUPPOSITORY    Unwrap and insert 1 PR PRN nausea, vomiting or headache   Plan discharge  Devoria Albe, MD, FACEP   MDM   I personally performed the services described in this documentation, which was scribed in my presence. The recorded information has been reviewed and considered. Devoria Albe, MD, Armando Gang     Ward Givens, MD 08/23/11 1149

## 2011-08-23 NOTE — ED Notes (Signed)
Pt c/o severe ha since yesterday with n/v. Denies visual disturbances/numbness/tingling.

## 2011-08-26 ENCOUNTER — Ambulatory Visit (INDEPENDENT_AMBULATORY_CARE_PROVIDER_SITE_OTHER): Payer: Self-pay | Admitting: Internal Medicine

## 2011-08-26 ENCOUNTER — Encounter (INDEPENDENT_AMBULATORY_CARE_PROVIDER_SITE_OTHER): Payer: Self-pay | Admitting: *Deleted

## 2011-08-26 ENCOUNTER — Encounter (INDEPENDENT_AMBULATORY_CARE_PROVIDER_SITE_OTHER): Payer: Self-pay | Admitting: Internal Medicine

## 2011-08-26 ENCOUNTER — Ambulatory Visit (INDEPENDENT_AMBULATORY_CARE_PROVIDER_SITE_OTHER): Payer: BC Managed Care – PPO | Admitting: Internal Medicine

## 2011-08-26 DIAGNOSIS — R112 Nausea with vomiting, unspecified: Secondary | ICD-10-CM | POA: Insufficient documentation

## 2011-08-26 DIAGNOSIS — K900 Celiac disease: Secondary | ICD-10-CM | POA: Insufficient documentation

## 2011-08-26 DIAGNOSIS — R197 Diarrhea, unspecified: Secondary | ICD-10-CM

## 2011-08-26 MED ORDER — DICYCLOMINE HCL 10 MG PO CAPS: 10.0000 mg | ORAL_CAPSULE | Freq: Two times a day (BID) | ORAL | Status: DC

## 2011-08-26 NOTE — Patient Instructions (Signed)
EGD with Dr Karilyn Cota.   Phenergan for nausea. Bentyl e-prescribed to [facility].

## 2011-08-26 NOTE — Progress Notes (Signed)
Subjective:     Patient ID: Tina Ramirez, female   DOB: 06-Jul-1975, 36 y.o.   MRN: [id number]  HPI Aniyla is a 36 yr old female with c/o nausea and vomiting. She is also having diarrhea. Symptoms for years. Her nausea and vomiting has been occuring for years.  She has had several admission for nausea and vomiting. Celiac panel was positive last year. Four pound weight loss since Monday. No fever.  When she eats she get sick: nauseated. She avoids  Gluten.  She will also have diarrhea.  She says she feels her best when her stomach is empty. No acid reflux.  She has 2-3 stools a day and are always loose.   Her stools studies have been negative in the past.   11/06/2009 CT abdomen/pelvis with CM (Nausea, vomiting, and diarrhea) IMPRESSION:  1. Nonspecific mild thickening of the wall of the proximal jejunal  bowel loops suspicious for mild enteritis.  2. No small bowel or colonic obstruction.  3. No hydronephrosis or hydroureter. Mild prominent renal pelvis  bilaterally.  4. Normal appendix.   Review of Systems Current Outpatient Prescriptions  Medication Sig Dispense Refill  . acetaminophen (TYLENOL) 500 MG tablet Take 1,000 mg by mouth every 6 (six) hours as needed. Pain      . promethazine (PHENERGAN) 25 MG suppository Unwrap and insert 1 PR PRN nausea, vomiting or headache  6 each  0  . dicyclomine (BENTYL) 10 MG capsule Take 1 capsule (10 mg total) by mouth 2 (two) times daily before a meal.  60 capsule  1   Past Medical History  Diagnosis Date  . Celiac disease    Past Surgical History  Procedure Date  . Cesarean section     x 2  . Hand surgery     right x 2    History   Social History  . Marital Status: Divorced    Spouse Name: N/A    Number of Children: N/A  . Years of Education: N/A   Occupational History  . Not on file.   Social History Main Topics  . Smoking status: Never Smoker   . Smokeless tobacco: Not on file  . Alcohol Use: Yes     occasional  . Drug  Use: No  . Sexually Active: Yes    Birth Control/ Protection: Inserts   Other Topics Concern  . Not on file   Social History Narrative  . No narrative on file   Family Status  Relation Status Death Age  . Mother Alive     fibromyalgia, HTN  . Father Alive     health unknown   Allergies  Allergen Reactions  . Gluten     Celiac Disease       Objective:   Physical Exam Filed Vitals:   08/26/11 1015  Height: 5\' 1"  (1.549 m)  Weight: 102 lb 1.6 oz (46.312 kg)    Alert and oriented. Skin warm and dry. Oral mucosa is moist.   . Sclera anicteric, conjunctivae is pink. Thyroid not enlarged. No cervical lymphadenopathy. Lungs clear. Heart regular rate and rhythm.  Abdomen is soft. Bowel sounds are positive. No hepatomegaly. No abdominal masses felt. No tenderness.  No edema to lower extremities.       Assessment:   Nausea: Continue with the Phenergan. Make cut in half.  H. Pylori screening. Will schedule and EGD with Dr. Karilyn Cota Diarrhea: Bentyl 10mg  BID.    Plan:

## 2011-08-27 ENCOUNTER — Other Ambulatory Visit (INDEPENDENT_AMBULATORY_CARE_PROVIDER_SITE_OTHER): Payer: Self-pay | Admitting: *Deleted

## 2011-08-27 DIAGNOSIS — K900 Celiac disease: Secondary | ICD-10-CM

## 2011-08-27 DIAGNOSIS — R112 Nausea with vomiting, unspecified: Secondary | ICD-10-CM

## 2011-08-27 LAB — H. PYLORI ANTIBODY, IGG: H Pylori IgG: 0.44 {ISR}

## 2011-08-30 NOTE — Progress Notes (Signed)
Addended by: Len Blalock on: 08/30/2011 08:49 AM   Modules accepted: Level of Service

## 2011-09-14 ENCOUNTER — Encounter (INDEPENDENT_AMBULATORY_CARE_PROVIDER_SITE_OTHER): Payer: Self-pay

## 2011-09-17 ENCOUNTER — Encounter (HOSPITAL_COMMUNITY): Payer: Self-pay | Admitting: Emergency Medicine

## 2011-09-17 ENCOUNTER — Emergency Department (HOSPITAL_COMMUNITY)
Admission: EM | Admit: 2011-09-17 | Discharge: 2011-09-17 | Disposition: A | Discharge status: Home / Self Care | Payer: BC Managed Care – PPO | Attending: Emergency Medicine | Admitting: Emergency Medicine

## 2011-09-17 DIAGNOSIS — R197 Diarrhea, unspecified: Secondary | ICD-10-CM | POA: Insufficient documentation

## 2011-09-17 DIAGNOSIS — K900 Celiac disease: Secondary | ICD-10-CM | POA: Insufficient documentation

## 2011-09-17 DIAGNOSIS — R112 Nausea with vomiting, unspecified: Secondary | ICD-10-CM

## 2011-09-17 LAB — DIFFERENTIAL
Basophils Absolute: 0 10*3/uL (ref 0.0–0.1)
Basophils Relative: 0 % (ref 0–1)
Eosinophils Absolute: 0.2 10*3/uL (ref 0.0–0.7)
Monocytes Relative: 12 % (ref 3–12)
Neutro Abs: 2.8 10*3/uL (ref 1.7–7.7)
Neutrophils Relative %: 54 % (ref 43–77)

## 2011-09-17 LAB — URINALYSIS, ROUTINE W REFLEX MICROSCOPIC
Glucose, UA: NEGATIVE mg/dL
Leukocytes, UA: NEGATIVE
Nitrite: NEGATIVE
Specific Gravity, Urine: >1.03 — ABNORMAL HIGH (ref 1.005–1.030)
pH: 6 (ref 5.0–8.0)

## 2011-09-17 LAB — COMPREHENSIVE METABOLIC PANEL
AST: 16 U/L (ref 0–37)
Albumin: 4 g/dL (ref 3.5–5.2)
Alkaline Phosphatase: 49 U/L (ref 39–117)
BUN: 11 mg/dL (ref 6–23)
Chloride: 102 mEq/L (ref 96–112)
Potassium: 3.9 mEq/L (ref 3.5–5.1)
Sodium: 139 mEq/L (ref 135–145)
Total Bilirubin: 0.5 mg/dL (ref 0.3–1.2)
Total Protein: 7.5 g/dL (ref 6.0–8.3)

## 2011-09-17 LAB — CBC
Hemoglobin: 13.6 g/dL (ref 12.0–15.0)
MCH: 32.9 pg (ref 26.0–34.0)
MCHC: 34.4 g/dL (ref 30.0–36.0)
Platelets: 191 10*3/uL (ref 150–400)
RDW: 12.4 % (ref 11.5–15.5)

## 2011-09-17 LAB — LIPASE, BLOOD: Lipase: 27 U/L (ref 11–59)

## 2011-09-17 MED ORDER — SODIUM CHLORIDE 0.9 % IV SOLN
1000.0000 mL | INTRAVENOUS | Status: DC
  Administered 2011-09-17: 1000 mL via INTRAVENOUS

## 2011-09-17 MED ORDER — SODIUM CHLORIDE 0.9 % IV SOLN
1000.0000 mL | Freq: Once | INTRAVENOUS | Status: AC
  Administered 2011-09-17: 1000 mL via INTRAVENOUS

## 2011-09-17 MED ORDER — ONDANSETRON HCL 4 MG/2ML IJ SOLN
4.0000 mg | Freq: Once | INTRAMUSCULAR | Status: AC
  Administered 2011-09-17: 4 mg via INTRAVENOUS
  Filled 2011-09-17: qty 2

## 2011-09-17 MED ORDER — ONDANSETRON HCL 4 MG PO TABS: 8.0000 mg | ORAL_TABLET | Freq: Three times a day (TID) | ORAL | Status: DC | PRN

## 2011-09-17 NOTE — ED Notes (Signed)
Iv started and meds given. Aware will have 2 liter boluses that lasts approx 2 hrs and will wait on all results. nad

## 2011-09-17 NOTE — ED Provider Notes (Signed)
Medical screening examination/treatment/procedure(s) were performed by non-physician practitioner and as supervising physician I was immediately available for consultation/collaboration.   Gentry Seeber, MD 09/17/11 1341 

## 2011-09-17 NOTE — ED Provider Notes (Signed)
History     CSN: [id number]  Arrival date & time 09/17/11  8295   First MD Initiated Contact with Patient 09/17/11 [phone]      Chief Complaint  Patient presents with  . Nausea  . Emesis  . Diarrhea    (Consider location/radiation/quality/duration/timing/severity/associated sxs/prior treatment) HPI Comments: Tina Ramirez presents with acute on chronic nausea with vomiting which has worsened over the past 48 hours.  She has a history of chronic nausea with vomiting and was diagnosed with celiac disease late last year.  She reports she has been adhering to a strict celiac diet but has continued intermittent episodes of vomiting with chronic nausea.  She is able to tolerate fluids, but any solid food she puts on her stomach results in nausea and emesis.  She is scheduled for an EGD in 5 days with Dr. Karilyn Cota for further evaluation of this condition.  She also reports diarrhea, describing watery brown stool, but only one episode daily, last occurring yesterday.  She has had no emesis today, her report she is also not had any solid intake.  She denies abdominal pain and fever.  She spoke to Dr. Karilyn Cota today who encouraged ED visit for rehydration in anticipation of her procedure next week.  She is using Phenergan which helps with her nausea, but makes her very drowsy.  She denies hematemesis.  Patient is a 36 y.o. female presenting with vomiting and diarrhea. The history is provided by the patient.  Emesis  Associated symptoms include diarrhea. Pertinent negatives include no abdominal pain, no arthralgias, no fever and no headaches.  Diarrhea The primary symptoms include nausea, vomiting and diarrhea. Primary symptoms do not include fever, abdominal pain, arthralgias or rash.    Past Medical History  Diagnosis Date  . Celiac disease     Past Surgical History  Procedure Date  . Cesarean section     x 2  . Hand surgery     right x 2     History reviewed. No pertinent family  history.  History  Substance Use Topics  . Smoking status: Never Smoker   . Smokeless tobacco: Not on file  . Alcohol Use: Yes     occasional    OB History    Grav Para Term Preterm Abortions TAB SAB Ect Mult Living                  Review of Systems  Constitutional: Negative for fever.  HENT: Negative for congestion, sore throat and neck pain.   Eyes: Negative.   Respiratory: Negative for chest tightness and shortness of breath.   Cardiovascular: Negative for chest pain.  Gastrointestinal: Positive for nausea, vomiting and diarrhea. Negative for abdominal pain and abdominal distention.  Genitourinary: Negative.   Musculoskeletal: Negative for joint swelling and arthralgias.  Skin: Negative.  Negative for rash and wound.  Neurological: Negative for dizziness, weakness, light-headedness, numbness and headaches.  Hematological: Negative.   Psychiatric/Behavioral: Negative.     Allergies  Gluten  Home Medications   Current Outpatient Rx  Name Route Sig Dispense Refill  . ACETAMINOPHEN 500 MG PO TABS Oral Take 1,000 mg by mouth every 6 (six) hours as needed. Pain    . DICYCLOMINE HCL 10 MG PO CAPS Oral Take 10 mg by mouth 2 (two) times daily before a meal.    . PROMETHAZINE HCL 25 MG PO TABS Oral Take 25 mg by mouth every 6 (six) hours as needed.    Marland Kitchen ONDANSETRON HCL 4  MG PO TABS Oral Take 2 tablets (8 mg total) by mouth every 8 (eight) hours as needed for nausea. 20 tablet 0    BP 122/84  Pulse 89  Temp 98.9 F (37.2 C)  Resp 18  Ht 5\' 1"  (1.549 m)  Wt 106 lb (48.081 kg)  BMI 20.03 kg/m2  SpO2 99%  LMP 08/23/2011  Physical Exam  Nursing note and vitals reviewed. Constitutional: She appears well-developed and well-nourished.  HENT:  Head: Normocephalic and atraumatic.  Eyes: Conjunctivae are normal.  Neck: Normal range of motion.  Cardiovascular: Normal rate, regular rhythm, normal heart sounds and intact distal pulses.   Pulmonary/Chest: Effort normal and  breath sounds normal. She has no wheezes.  Abdominal: Soft. Bowel sounds are normal. She exhibits no distension. There is no tenderness. There is no rebound and no guarding.  Musculoskeletal: Normal range of motion.  Neurological: She is alert.  Skin: Skin is warm and dry.  Psychiatric: She has a normal mood and affect.    ED Course  Procedures (including critical care time)  Labs Reviewed  COMPREHENSIVE METABOLIC PANEL - Abnormal; Notable for the following:    Glucose, Bld 104 (*)    All other components within normal limits  URINALYSIS, ROUTINE W REFLEX MICROSCOPIC - Abnormal; Notable for the following:    Specific Gravity, Urine >1.030 (*)    Bilirubin Urine SMALL (*)    Ketones, ur TRACE (*)    All other components within normal limits  CBC  DIFFERENTIAL  LIPASE, BLOOD  POCT PREGNANCY, URINE   No results found.   1. Nausea & vomiting    Patient was given 2 L of normal saline per IV while in ED.  She was also given Zofran for 4 mg IV with no nausea or vomiting while in ED.   MDM  labs reviewed prior to discharge home and normal.discuss results with patient.  She will be prescribed Zofran 4 hopeful nausea relief without the sedating effects that she is getting from Phenergan.  Plan to follow up with Dr. Dionicia Abler in 5 days as scheduled.  Recheck sooner for continued or worsened symptoms including fever, abdominal pain or uncontrolled vomiting.  The patient appears reasonably screened and/or stabilized for discharge and I doubt any other medical condition or other [facility] requiring further screening, evaluation, or treatment in the ED at this time prior to discharge.       Burgess Amor, PA 09/17/11 1155

## 2011-09-17 NOTE — Discharge Instructions (Signed)
Nausea and Vomiting  Nausea is a sick feeling that often comes before throwing up (vomiting). Vomiting is a reflex where stomach contents come out of your mouth. Vomiting can cause severe loss of body fluids (dehydration). Children and elderly adults can become dehydrated quickly, especially if they also have diarrhea. Nausea and vomiting are symptoms of a condition or disease. It is important to find the cause of your symptoms.  CAUSES    Direct irritation of the stomach lining. This irritation can result from increased acid production (gastroesophageal reflux disease), infection, food poisoning, taking certain medicines (such as nonsteroidal anti-inflammatory drugs), alcohol use, or tobacco use.   Signals from the brain.These signals could be caused by a headache, heat exposure, an inner ear disturbance, increased pressure in the brain from injury, infection, a tumor, or a concussion, pain, emotional stimulus, or metabolic problems.   An obstruction in the gastrointestinal tract (bowel obstruction).   Illnesses such as diabetes, hepatitis, gallbladder problems, appendicitis, kidney problems, cancer, sepsis, atypical symptoms of a heart attack, or eating disorders.   Medical treatments such as chemotherapy and radiation.   Receiving medicine that makes you sleep (general anesthetic) during surgery.  DIAGNOSIS  Your caregiver may ask for tests to be done if the problems do not improve after a few days. Tests may also be done if symptoms are severe or if the reason for the nausea and vomiting is not clear. Tests may include:   Urine tests.   Blood tests.   Stool tests.   Cultures (to look for evidence of infection).   X-rays or other imaging studies.  Test results can help your caregiver make decisions about treatment or the need for additional tests.  TREATMENT  You need to stay well hydrated. Drink frequently but in small amounts.You may wish to drink water, sports drinks, clear broth, or eat frozen  ice pops or gelatin dessert to help stay hydrated.When you eat, eating slowly may help prevent nausea.There are also some antinausea medicines that may help prevent nausea.  HOME CARE INSTRUCTIONS    Take all medicine as directed by your caregiver.   If you do not have an appetite, do not force yourself to eat. However, you must continue to drink fluids.   If you have an appetite, eat a normal diet unless your caregiver tells you differently.   Eat a variety of complex carbohydrates (rice, wheat, potatoes, bread), lean meats, yogurt, fruits, and vegetables.   Avoid high-fat foods because they are more difficult to digest.   Drink enough water and fluids to keep your urine clear or pale yellow.   If you are dehydrated, ask your caregiver for specific rehydration instructions. Signs of dehydration may include:   Severe thirst.   Dry lips and mouth.   Dizziness.   Dark urine.   Decreasing urine frequency and amount.   Confusion.   Rapid breathing or pulse.  SEEK IMMEDIATE MEDICAL CARE IF:    You have blood or brown flecks (like coffee grounds) in your vomit.   You have black or bloody stools.   You have a severe headache or stiff neck.   You are confused.   You have severe abdominal pain.   You have chest pain or trouble breathing.   You do not urinate at least once every 8 hours.   You develop cold or clammy skin.   You continue to vomit for longer than 24 to 48 hours.   You have a fever.  MAKE SURE YOU:      Understand these instructions.   Will watch your condition.   Will get help right away if you are not doing well or get worse.  Document Released: 04/05/2005 Document Revised: 03/25/2011 Document Reviewed: 09/02/2010  [employer] Patient Information 2012 [employer]

## 2011-09-17 NOTE — ED Notes (Signed)
Pt c/o n/v/d worse after eating. Denies pain. Pt scheduled EGD on Wednesday 09/22/11 with Dr.Ramon. nad noted.

## 2011-09-20 ENCOUNTER — Encounter (INDEPENDENT_AMBULATORY_CARE_PROVIDER_SITE_OTHER): Payer: Self-pay | Admitting: Internal Medicine

## 2011-09-20 ENCOUNTER — Observation Stay (HOSPITAL_COMMUNITY)
Admission: AD | Admit: 2011-09-20 | Discharge: 2011-09-22 | DRG: 813 | Disposition: A | Discharge status: Home / Self Care | Payer: BC Managed Care – PPO | Source: Ambulatory Visit | Attending: Internal Medicine | Admitting: Internal Medicine

## 2011-09-20 ENCOUNTER — Encounter (HOSPITAL_COMMUNITY): Payer: Self-pay | Admitting: Pharmacy Technician

## 2011-09-20 ENCOUNTER — Ambulatory Visit (HOSPITAL_COMMUNITY): Payer: BC Managed Care – PPO | Admitting: Internal Medicine

## 2011-09-20 ENCOUNTER — Encounter (HOSPITAL_COMMUNITY): Payer: Self-pay | Admitting: *Deleted

## 2011-09-20 VITALS — BP 120/78 | HR 72 | Temp 100.1°F | Resp 20 | Ht 61.0 in | Wt 100.1 lb

## 2011-09-20 DIAGNOSIS — R1110 Vomiting, unspecified: Secondary | ICD-10-CM

## 2011-09-20 DIAGNOSIS — E860 Dehydration: Secondary | ICD-10-CM

## 2011-09-20 DIAGNOSIS — K900 Celiac disease: Secondary | ICD-10-CM | POA: Diagnosis present

## 2011-09-20 DIAGNOSIS — R109 Unspecified abdominal pain: Secondary | ICD-10-CM

## 2011-09-20 DIAGNOSIS — K5289 Other specified noninfective gastroenteritis and colitis: Principal | ICD-10-CM | POA: Diagnosis present

## 2011-09-20 DIAGNOSIS — R195 Other fecal abnormalities: Secondary | ICD-10-CM | POA: Diagnosis present

## 2011-09-20 DIAGNOSIS — R112 Nausea with vomiting, unspecified: Secondary | ICD-10-CM

## 2011-09-20 LAB — COMPREHENSIVE METABOLIC PANEL
ALT: 7 U/L (ref 0–35)
AST: 14 U/L (ref 0–37)
Albumin: 4 g/dL (ref 3.5–5.2)
CO2: 27 mEq/L (ref 19–32)
Calcium: 9.8 mg/dL (ref 8.4–10.5)
Creatinine, Ser: 0.65 mg/dL (ref 0.50–1.10)
GFR calc non Af Amer: >90 mL/min (ref >90)
Sodium: 139 mEq/L (ref 135–145)
Total Protein: 7.8 g/dL (ref 6.0–8.3)

## 2011-09-20 LAB — CBC
MCH: 32.3 pg (ref 26.0–34.0)
Platelets: 208 10*3/uL (ref 150–400)
RBC: 4.02 MIL/uL (ref 3.87–5.11)
RDW: 12.3 % (ref 11.5–15.5)

## 2011-09-20 LAB — DIFFERENTIAL
Eosinophils Relative: 3 % (ref 0–5)
Lymphocytes Relative: 39 % (ref 12–46)
Lymphs Abs: 2.2 10*3/uL (ref 0.7–4.0)
Monocytes Absolute: 0.3 10*3/uL (ref 0.1–1.0)
Monocytes Relative: 5 % (ref 3–12)
Neutro Abs: 2.9 10*3/uL (ref 1.7–7.7)

## 2011-09-20 MED ORDER — SODIUM CHLORIDE 0.9 % IJ SOLN
INTRAMUSCULAR | Status: AC
  Administered 2011-09-20: 10 mL
  Filled 2011-09-20: qty 3

## 2011-09-20 MED ORDER — POTASSIUM CHLORIDE 2 MEQ/ML IV SOLN: INTRAVENOUS | Status: DC

## 2011-09-20 MED ORDER — PANTOPRAZOLE SODIUM 40 MG IV SOLR
40.0000 mg | Freq: Every day | INTRAVENOUS | Status: DC
  Administered 2011-09-20 – 2011-09-21 (×2): 40 mg via INTRAVENOUS
  Filled 2011-09-20 (×2): qty 40

## 2011-09-20 MED ORDER — ONDANSETRON HCL 4 MG/2ML IJ SOLN
4.0000 mg | Freq: Four times a day (QID) | INTRAMUSCULAR | Status: DC | PRN
  Administered 2011-09-20 – 2011-09-21 (×2): 4 mg via INTRAVENOUS
  Filled 2011-09-20 (×2): qty 2

## 2011-09-20 MED ORDER — KCL IN DEXTROSE-NACL 20-5-0.9 MEQ/L-%-% IV SOLN
INTRAVENOUS | Status: DC
  Administered 2011-09-20 – 2011-09-21 (×2): via INTRAVENOUS

## 2011-09-20 MED ORDER — ONDANSETRON HCL 4 MG PO TABS: 4.0000 mg | ORAL_TABLET | Freq: Four times a day (QID) | ORAL | Status: DC | PRN

## 2011-09-20 MED ORDER — ACETAMINOPHEN 650 MG RE SUPP: 650.0000 mg | Freq: Four times a day (QID) | RECTAL | Status: DC | PRN

## 2011-09-20 MED ORDER — SODIUM CHLORIDE 0.45 % IV SOLN
Freq: Once | INTRAVENOUS | Status: AC
  Administered 2011-09-21: 10 mL/h via INTRAVENOUS

## 2011-09-20 MED ORDER — ACETAMINOPHEN 325 MG PO TABS
650.0000 mg | ORAL_TABLET | Freq: Four times a day (QID) | ORAL | Status: DC | PRN
  Administered 2011-09-20 – 2011-09-22 (×2): 650 mg via ORAL
  Filled 2011-09-20 (×2): qty 2

## 2011-09-20 NOTE — Progress Notes (Signed)
HISTORY & PHYSICAL  PRESENTING COMPLAINT: Nausea, vomiting and diarrhea of 9 days duration.  Weakness.  HISTORY OF PRESENT ILLNESS: Tina Ramirez is a 36 year old Caucasian female  who carries a diagnosis of celiac disease which is reviewed in detail  under past medical history, who presents to the office today for a  unscheduled visit because of acute symptoms. She went to the beach a  week before last. She got sick weekend before last. She developed  postprandial nausea with diarrhea. She backed off on her diet and  sterone liquids. Her nausea and diarrhea persisted and she also  developed emesis and she would throw up food and/or clear liquids. She  also noted profound weakness. She thought she was going to pass out.  She went to the emergency room onMay 31, 2013. She had lab studies, her  CBC was normal with WBC of 5.1, H and H of 13.6 and 39.5, platelet count  was 191 K. She also had comprehensive chemistry panel, which was normal  except borderline glucose of 104. Urine pregnancy test was negative.  Urinalysis revealed specific gravity of greater than 1030 and trace  ketones. The patient was given IV fluids and she felt much better and  she was discharged. However, over the weekend, she still was not able  to eat any food. She has been able to keep some liquids down. In spite  of minimal p.o. intake, she has been vomiting. She continues to have  loose watery stools at least 4-6 per day. She denies melena or frank  rectal bleeding. She has noted small amount of fresh blood, which she  believes is secondary to hemorrhoids. She also has noted crampy  abdominal pain, but nothing severe. Once again, she has noted extreme  weakness. Few times when she stood up, she thought she was going to  pass out. She has lost 8 pounds since her symptoms started about 9 days  ago.  The patient was seen in our office by Ms. Charissa Bash, NP on Aug 26, 2011. She was begun on dicyclomine and she had H. pylori  serology which  was negative.  The patient recalls that she has had issues with her abdomen, since she  was in school. Different times, she has been given different diagnosis  including IBS. Last year, she was seen by Dr. Lilyan Punt and had  celiac serology. She was diagnosed with celiac disease and begun on  gluten free diet. She was seen by dietitian. She states she is 100%  compliant. However, she has continued to have intermittent symptoms of  nausea, vomiting and diarrhea. Today, she has noted low-grade fever,  but she denies chills, cough, shortness of breath, skin rash, joint  pain, vaginal discharge, dysuria or hematuria.  CURRENT MEDICATIONS: Include; Zofran 8 mg p.o. t.i.d. p.r.n.,  promethazine 25 mg p.o. q.6 p.r.n.  PAST SURGERY HISTORY: She had C-section in 1999 and 2006.  She had fracture to one of her bones in a right hand about 10 years ago  and she had pain place. Subsequently, the pain was removed along with a  small piece of cartilage. She had tubes in both ears as a child.  History of cervical dysplasia about 3 years ago. She has scraping done  in subsequent exams have been normal.  She was diagnosed with celiac disease in August 2012, as noted above. A  blood work is available for review and reveals TTG antibody IgG of 6.5  units per mL which is normal.  Leiden antibody IgG was positive at 36.4,  but gliadin antibody IgA was normal at 0.6. She did not have endomysial  antibody or duodenal biopsy.  ALLERGIES: None to medications.  FAMILY HISTORY: Her mother has diabetes mellitus. Health state of  biologic father is unknown. She does not have any siblings.  SOCIAL HISTORY: She is divorced. She has healthy 73 year old son. 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 She has  never smoked cigarettes and drinks alcohol very occasionally.  PHYSICAL EXAMINATION: VITAL SIGNS: Weight 100.1 pound, she is 61  inches tall, supine pulse is 72 and blood  pressure is 120/80, standing  pulse is 88 and blood pressure is 100/66, her temp is 100.1.  Respiratory rate is 20.  GENERAL: The patient appears to be acutely ill and; however, in no  acute distress.  EYES: Conjunctivae are pink. Sclerae are nonicteric.  MOUTH: Oropharyngeal mucosa is somewhat dry.  NECK: No neck masses or thyromegaly noted.  CARDIAC: With regular rhythm. Normal S1 and S2. No murmur or gallop  noted.  LUNGS: Clear to auscultation.  ABDOMEN: Symmetrical. Bowel sounds are normal. On palpation, soft  abdomen with mild tenderness, LLQ and RLQ. Slightly more pronounced on  the left side. No organomegaly or masses noted.  RECTAL: Deferred.  EXTREMITIES: No peripheral edema or clubbing or skin rash noted.  ASSESSMENT: The patient is a 36 year old Caucasian female who carries a  diagnosis of celiac disease who presents with acute illness which  started about 9 days ago with nausea, postprandial diarrhea followed by  nausea, vomiting, diarrhea and abdominal cramps. She was treated in  emergency room 3 days ago with IV fluids. Her urinalysis at that time  was abnormal with specific gravity of greater than 30 and trace ketones.  She is orthostatic. Her acute symptoms maybe related to an infection or  she could have inflammatory bowel disease.  I do not believe she has celiac disease. Even if she has celiac  disease, she has been compliant with gluten free diet and, therefore,  her symptoms could not be explained on basis of this diagnosis.  RECOMMENDATIONS AND PLAN: The patient will be admitted for IV fluids  and further evaluation with stool studies as well as abdominopelvic CT.  She may need to be re-evaluated for her celiac disease, but will delay  this for now.  ______________________________  Lionel December, M.D.  NR/MEDQ D: 09/20/2011 T: 09/20/2011 Job: [id number]  cc: Lorin Picket A. Gerda Diss, MD  Fax: [phone]

## 2011-09-20 NOTE — H&P (Signed)
NAMENIHAL, DOAN                 ACCOUNT NO.:  [id number]  MEDICAL RECORD NO.:  [id number]  LOCATION:  A330                          FACILITY:  [facility]  PHYSICIAN:  Lionel December, M.D.    DATE OF BIRTH:  10-15-1975  DATE OF ADMISSION:  09/20/2011 DATE OF DISCHARGE:  LH                             HISTORY & PHYSICAL   PRESENTING COMPLAINT:  Nausea, vomiting and diarrhea of 9 days duration.  Weakness.  HISTORY OF PRESENT ILLNESS:  Tina Ramirez is a 36 year old Caucasian female who carries a diagnosis of celiac disease which is reviewed in detail under past medical history, who presents to the office today for a unscheduled visit because of acute symptoms.  She went to the beach a week before last.  She got sick weekend before last.  She developed postprandial nausea with diarrhea.  She backed off on her diet and sterone liquids.  Her nausea and diarrhea persisted and she also developed emesis and she would throw up food and/or clear liquids.  She also noted profound weakness.  She thought she was going to pass out. She went to the emergency room onMay 31, 2013.  She had lab studies, her CBC was normal with WBC of 5.1, H and H of 13.6 and 39.5, platelet count was 191 K.  She also had comprehensive chemistry panel, which was normal except borderline glucose of 104.  Urine pregnancy test was negative. Urinalysis revealed specific gravity of greater than 1030 and trace ketones.  The patient was given IV fluids and she felt much better and she was discharged.  However, over the weekend, she still was not able to eat any food.  She has been able to keep some liquids down.  In spite of minimal p.o. intake, she has been vomiting.  She continues to have loose watery stools at least 4-6 per day.  She denies melena or frank rectal bleeding.  She has noted small amount of fresh blood, which she believes is secondary to hemorrhoids.  She also has noted crampy abdominal pain, but nothing severe.  Once  again, she has noted extreme weakness.  Few times when she stood up, she thought she was going to pass out.  She has lost 6 pounds since her symptoms started about 9 days ago.  The patient was seen in our office by Ms. Dorene Ar, NP on Aug 26, 2011.  She was begun on dicyclomine and she had H. pylori serology which was negative.  The patient recalls that she has had issues with her abdomen, since she was in school.  At different times, she has been given different diagnoses including IBS.  Last year, she was seen by Dr. Lilyan Punt and had celiac serology.  She was diagnosed with celiac disease and begun on gluten free diet.  She was seen by dietitian.  She states she is 100% compliant.  However, she has continued to have intermittent symptoms of nausea, vomiting and diarrhea.  Today, she has noted low-grade fever, but she denies chills, cough, shortness of breath, skin rash, joint pain, vaginal discharge, dysuria or hematuria.  CURRENT MEDICATIONS:  Include; Zofran 8 mg p.o. t.i.d. p.r.n., promethazine 25 mg  p.o. q.6 p.r.n.  PAST SURGERY HISTORY:  She had C-section in 1999 and 2006.  She had fracture to one of her bones in a right hand about 10 years ago and she had pain place.  Subsequently, the pain was removed along with a small piece of cartilage.  She had tubes in both ears as a child.  History of cervical dysplasia about 3 years ago.  She has scraping done in subsequent exams have been normal.  She was diagnosed with celiac disease in August 2012, as noted above.  A blood work is available for review and reveals TTG antibody IgG of 6.5 units per mL which is normal.  Leiden antibody IgG was positive at 36.4, but gliadin antibody IgA was normal at 0.6.  She did not have endomysial antibody or duodenal biopsy.  ALLERGIES:  None to medications.  FAMILY HISTORY:  Her mother has diabetes mellitus.  Health state of biologic father is unknown.  She does not have any  siblings.  SOCIAL HISTORY:  She is divorced.  She has healthy 71 year old son.  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  She has never smoked cigarettes and drinks alcohol very occasionally.  PHYSICAL EXAMINATION:  VITAL SIGNS:  Weight 100.1 pound, she is 61 inches tall, supine pulse is 72 and blood pressure is 120/80, standing pulse is 88 and blood pressure is 100/66, her temp is 100.1. Respiratory rate is 20. GENERAL:  The patient appears to be acutely ill and; however, in no acute distress. EYES:  Conjunctivae are pink.  Sclerae are nonicteric. MOUTH:  Oropharyngeal mucosa is somewhat dry. NECK:  No neck masses or thyromegaly noted. CARDIAC:  With regular rhythm.  Normal S1 and S2.  No murmur or gallop noted. LUNGS:  Clear to auscultation. ABDOMEN:  Symmetrical.  Bowel sounds are normal.  On palpation, soft abdomen with mild tenderness, LLQ and RLQ.  Slightly more pronounced on the left side.  No organomegaly or masses noted. RECTAL:  Deferred. EXTREMITIES:  No peripheral edema or clubbing or skin rash noted.  ASSESSMENT:  The patient is a 36 year old Caucasian female who carries a diagnosis of celiac disease who presents with acute illness which started about 9 days ago with nausea, postprandial diarrhea followed by nausea, vomiting, diarrhea and abdominal cramps.  She was treated in emergency room 3 days ago with IV fluids.  Her urinalysis at that time was abnormal with specific gravity of greater than 30 and trace ketones. She is orthostatic.  Her acute symptoms maybe related to an infection or she could have inflammatory bowel disease.  I do not believe she has celiac disease.  Even if she has celiac disease, she has been compliant with gluten free diet and, therefore, her symptoms could not be explained on basis of this diagnosis.  RECOMMENDATIONS AND PLAN:  The patient will be admitted for IV fluids and further evaluation with stool studies as  well as abdominopelvic CT.  She may need to be re-evaluated for her celiac disease, but will delay this for now.                                           ______________________________ Lionel December, M.D.     NR/MEDQ  D:  09/20/2011  T:  09/20/2011  Job:  [id number]  cc:   Lorin Picket A. Gerda Diss, MD Fax: [phone]

## 2011-09-21 ENCOUNTER — Inpatient Hospital Stay (HOSPITAL_COMMUNITY): Payer: BC Managed Care – PPO

## 2011-09-21 ENCOUNTER — Encounter (HOSPITAL_COMMUNITY): Payer: Self-pay | Admitting: Radiology

## 2011-09-21 DIAGNOSIS — R197 Diarrhea, unspecified: Secondary | ICD-10-CM

## 2011-09-21 DIAGNOSIS — A499 Bacterial infection, unspecified: Secondary | ICD-10-CM

## 2011-09-21 DIAGNOSIS — B9689 Other specified bacterial agents as the cause of diseases classified elsewhere: Secondary | ICD-10-CM

## 2011-09-21 DIAGNOSIS — R112 Nausea with vomiting, unspecified: Secondary | ICD-10-CM

## 2011-09-21 DIAGNOSIS — E860 Dehydration: Secondary | ICD-10-CM

## 2011-09-21 LAB — CLOSTRIDIUM DIFFICILE BY PCR: Toxigenic C. Difficile by PCR: POSITIVE — AB

## 2011-09-21 MED ORDER — SODIUM CHLORIDE 0.45 % IV SOLN
INTRAVENOUS | Status: DC
  Administered 2011-09-22: 08:00:00 via INTRAVENOUS

## 2011-09-21 MED ORDER — SACCHAROMYCES BOULARDII 250 MG PO CAPS: 250.0000 mg | ORAL_CAPSULE | ORAL | Status: DC

## 2011-09-21 MED ORDER — IOHEXOL 300 MG/ML  SOLN
100.0000 mL | Freq: Once | INTRAMUSCULAR | Status: AC | PRN
  Administered 2011-09-21: 100 mL via INTRAVENOUS

## 2011-09-21 MED ORDER — METRONIDAZOLE 500 MG PO TABS
500.0000 mg | ORAL_TABLET | Freq: Three times a day (TID) | ORAL | Status: DC
  Administered 2011-09-21 – 2011-09-22 (×2): 500 mg via ORAL
  Filled 2011-09-21 (×2): qty 1

## 2011-09-21 MED ORDER — FLORA-Q PO CAPS
1.0000 | ORAL_CAPSULE | Freq: Every day | ORAL | Status: DC
  Administered 2011-09-22: 1 via ORAL
  Filled 2011-09-21: qty 1

## 2011-09-21 NOTE — Progress Notes (Signed)
Subjective; Patient feels better. She does not feel lightheaded when she walks to the bathroom. She has no appetite. She complains of abdominal pain after she drank contrast for CT. She has not passed stool since she was admitted. Objective; BP 101/66  Pulse 63  Temp(Src) 98.5 F (36.9 C) (Oral)  Resp 18  Ht 5\' 1"  (1.549 m)  Wt 100 lb 1.4 oz (45.4 kg)  BMI 18.91 kg/m2  SpO2 99%  LMP 09/18/2011 Abdomen is flat. Bowel sounds are normal. On palpation soft abdomen with mild tenderness at LLQ. Lab data from admission; Normal CBC with WBC of 5.5; eosinophils 3%; monocytes 5% Comprehensive chemistry panel is normal and magnesium 2.0. Abdominopelvic CT reviewed. No evidence of thickened small or large bowel. Fecal matter in the colon. No adenopathy or ascites. Assessment; Acute symptoms most likely gastroenteritis. Stool studies are pending. Plan; Advance diet and allow gluten. EGD with duodenal biopsy on an outpatient basis.

## 2011-09-21 NOTE — Care Management Note (Signed)
    Page 1 of 1   09/21/2011     1:52:27 PM   CARE MANAGEMENT NOTE 09/21/2011  Patient:  Tina Ramirez, Tina Ramirez   Account Number:  [id number]  Date Initiated:  09/21/2011  Documentation initiated by:  Sharrie Rothman  Subjective/Objective Assessment:   Pt admitted from home with nausea and vomiting. Pt lives at home with a son and will return home at discharge. Pt is independent with ADL's.     Action/Plan:   No CM needs noted at this time.   Anticipated DC Date:  09/27/2011   Anticipated DC Plan:  HOME/SELF CARE      DC Planning Services  CM consult      Choice offered to / List presented to:             Status of service:  Completed, signed off Medicare Important Message given?   (If response is "NO", the following Medicare IM given date fields will be blank) Date Medicare IM given:   Date Additional Medicare IM given:    Discharge Disposition:    Per UR Regulation:    If discussed at Long Length of Stay Meetings, dates discussed:    Comments:  09/21/11 1351 Arlyss Queen, RN BSN CM

## 2011-09-21 NOTE — Progress Notes (Addendum)
Lab results called. Pt. Positive for c-diff. Md notified, orders given. Will follow out orders and continue to monitor.

## 2011-09-21 NOTE — Progress Notes (Signed)
UR Chart Review Completed  

## 2011-09-22 ENCOUNTER — Ambulatory Visit (HOSPITAL_COMMUNITY)
Admission: RE | Admit: 2011-09-22 | Payer: BC Managed Care – PPO | Source: Ambulatory Visit | Admitting: Internal Medicine

## 2011-09-22 ENCOUNTER — Other Ambulatory Visit (INDEPENDENT_AMBULATORY_CARE_PROVIDER_SITE_OTHER): Payer: Self-pay | Admitting: *Deleted

## 2011-09-22 ENCOUNTER — Encounter (HOSPITAL_COMMUNITY): Admission: RE | Payer: Self-pay | Source: Ambulatory Visit

## 2011-09-22 DIAGNOSIS — R112 Nausea with vomiting, unspecified: Secondary | ICD-10-CM

## 2011-09-22 SURGERY — EGD (ESOPHAGOGASTRODUODENOSCOPY)
Anesthesia: Moderate Sedation

## 2011-09-22 MED ORDER — FLORA-Q PO CAPS: 1.0000 | ORAL_CAPSULE | Freq: Every day | ORAL | Status: DC

## 2011-09-22 MED ORDER — METRONIDAZOLE 500 MG PO TABS: 500.0000 mg | ORAL_TABLET | Freq: Three times a day (TID) | ORAL | Status: DC

## 2011-09-22 MED ORDER — PANTOPRAZOLE SODIUM 40 MG IV SOLR: 40.0000 mg | Freq: Every day | INTRAVENOUS | Status: DC

## 2011-09-22 NOTE — Patient Instructions (Addendum)
Admission called.  Will have a bed in 15 minutes and will call back when patient can come. Patient is to go to [facility] for direct admit to hospital.

## 2011-09-22 NOTE — Progress Notes (Signed)
   Subjective: Feels better. No diarrhea. No nausea or vomiting. She denies abdominal pain.  She did keep down her breakfast this am. C difficile came back positive. She says she feels 70% better.  Objective: Vital signs in last 24 hours: Temp:  [97.1 F (36.2 C)-98.7 F (37.1 C)] 97.1 F (36.2 C) (06/05 0511) Pulse Rate:  [56-63] 56  (06/05 0511) Resp:  [18] 18  (06/05 0511) BP: (96-108)/(62-71) 96/62 mmHg (06/05 0511) SpO2:  [98 %-100 %] 98 % (06/05 0511) Last BM Date: 09/20/11  Intake/Output from previous day: 06/04 0701 - 06/05 0700 In: -  Out: 500 [Urine:500] Intake/Output this shift:    General appearance: alert, cooperative and no distress Alert. Lungs are clear. Abdomen is soft. BS are positive. No abdominal pain. No masses felt.   Lab Results:  [facility] 09/20/11 1811  WBC 5.5  HGB 13.0  HCT 37.9  PLT 208   BMET  Basename 09/20/11 1811  NA 139  K 3.8  CL 102  CO2 27  GLUCOSE 85  BUN 10  CREATININE 0.65  CALCIUM 9.8   LFT  Basename 09/20/11 1811  PROT 7.8  ALBUMIN 4.0  AST 14  ALT 7  ALKPHOS 48  BILITOT 0.6  BILIDIR --  IBILI --   PT/INR No results found for this basename: LABPROT:2,INR:2 in the last 72 hours Hepatitis Panel No results found for this basename: HEPBSAG,HCVAB,HEPAIGM,HEPBIGM in the last 72 hours C-Diff No results found for this basename: CDIFFTOX:3 in the last 72 hours Fecal Lactopherrin No results found for this basename: FECLLACTOFRN in the last 72 hours  Studies/Results: Ct Abdomen Pelvis W Contrast  09/21/2011  *RADIOLOGY REPORT*  Clinical Data: Abdominal pain.  Nausea and vomiting.  Diarrhea.  CT ABDOMEN AND PELVIS WITH CONTRAST  Technique:  Multidetector CT imaging of the abdomen and pelvis was performed following the standard protocol during bolus administration of intravenous contrast.  Contrast: OMNIPAQUE IOHEXOL 300 MG/ML  SOLN  Comparison: 11/06/2009  Findings: Good opacification of bowel with oral contrast  material is seen, and there is no evidence of bowel wall thickening or dilatation.  No other inflammatory process or abnormal fluid collections are seen within the abdomen or pelvis. Normal contrast filled appendix is visualized.  The liver, gallbladder, pancreas, spleen, adrenal glands, and kidneys are normal in appearance.  Uterus and adnexa are unremarkable.  No soft tissue masses or lymphadenopathy identified within the abdomen or pelvis. No evidence of hernia.  IMPRESSION: Negative.  No acute findings or other significant abnormality identified.  Original Report Authenticated By: Danae Orleans, M.D.    Medications: I have reviewed the patient's current medications.  Assessment/Plan:  Acute symptoms most likely gastroenteritis. She did however have a postive C. Diff.  Stool studies are pending Plan;    EGD with duodenal biopsy on an outpatient basis.      LOS: 2 days   Tina Ramirez W 09/22/2011, 8:55 AM

## 2011-09-22 NOTE — Progress Notes (Signed)
Discharge instructions given to pt. With understanding verbalized, pt. Taken to car via w/c. Pt. To go to Dr.Rehman's office to get rest of prescriptions.

## 2011-09-23 ENCOUNTER — Telehealth (INDEPENDENT_AMBULATORY_CARE_PROVIDER_SITE_OTHER): Payer: Self-pay | Admitting: *Deleted

## 2011-09-23 LAB — FECAL LACTOFERRIN, QUANT: Fecal Lactoferrin: POSITIVE

## 2011-09-23 LAB — GIARDIA/CRYPTOSPORIDIUM SCREEN(EIA): Giardia Screen - EIA: NEGATIVE

## 2011-09-23 NOTE — Telephone Encounter (Signed)
Clarified E-Prescription , called [facility]/[place]. Pantoprazole (Protonix) 40 mg take 1 by mouth every morning-#30 with 11 refills

## 2011-09-26 LAB — STOOL CULTURE

## 2011-09-26 NOTE — Discharge Summary (Signed)
Principal diagnoses; Clostridium difficile infection. Diarrhea secondary to above. Nausea vomiting secondary to above. Dehydration. Miscellaneous diagnoses; ? History of celiac disease. Condition at the time of discharge. Improved. Discharge medications; Metronidazole 500 mg by mouth 3 times a day for 2 weeks. Flora-Q one capsule by mouth daily for one-month. Pantoprazole 40 mg by mouth every morning. Ondansetron 8 mg by mouth 3 times a day when necessary. Hospital course. Patient is 36 year old Caucasian female who was admitted while office visit she presented with 9 day history of diarrhea associated with nausea and vomiting. She did not respond to symptomatic therapy. She was seen in emergency room 3 days earlier and treated with IV fluids and discharge. Review of those records revealed urine specific gravity of greater than 1030 and she had trace ketones in her urine. Patient was felt to be acutely ill when she was seen in the office and was orthostatic. She was therefore directly admitted for IV fluids and further management. CBC and comprehensive chemistry panel surprisingly were normal. She was begun on IV fluids as well as PPI and stool studies were ordered. She underwent abdominopelvic CT with contrast the following morning and no acute abnormality was noted. Fecal lactoferrin was positive along with positive C. difficile by PCR. Stool culture and O&P were negative. Patient was begun on metronidazole and she felt a lot better after 2 doses. She was also begun on Florastor. Her diet was advanced and she did not experience any nausea or vomiting. Regarding patient's history of celiac disease I reviewed her records and noted that diagnoses is made on the basis of positive anti-gliadin antibody IgG. Tissue trans-glutaminase IgG antibody was negative. I therefore recommended that she go back on regular diet should return for EGD with duodenal biopsy in two weeks. Patient was feeling much  better at the time of discharge.

## 2011-09-28 ENCOUNTER — Telehealth (INDEPENDENT_AMBULATORY_CARE_PROVIDER_SITE_OTHER): Payer: Self-pay | Admitting: *Deleted

## 2011-09-28 ENCOUNTER — Encounter (HOSPITAL_COMMUNITY): Payer: Self-pay

## 2011-09-28 NOTE — Telephone Encounter (Signed)
Tina Ramirez called and left a message that she was having a little discomfort in left side and Dr.Rehman ask that she let him know. Per Dr.Rehman this is okay patient is to have a EGD next week, the pain was not on the right side. Patient made aware

## 2011-10-05 MED ORDER — SODIUM CHLORIDE 0.45 % IV SOLN
Freq: Once | INTRAVENOUS | Status: AC
  Administered 2011-10-06: 09:00:00 via INTRAVENOUS

## 2011-10-06 ENCOUNTER — Encounter (HOSPITAL_COMMUNITY): Payer: Self-pay

## 2011-10-06 ENCOUNTER — Ambulatory Visit (HOSPITAL_COMMUNITY)
Admission: RE | Admit: 2011-10-06 | Discharge: 2011-10-06 | Disposition: A | Discharge status: Home / Self Care | Payer: BC Managed Care – PPO | Source: Ambulatory Visit | Attending: Internal Medicine | Admitting: Internal Medicine

## 2011-10-06 ENCOUNTER — Encounter (HOSPITAL_COMMUNITY)
Admission: RE | Disposition: A | Discharge status: Home / Self Care | Payer: Self-pay | Source: Ambulatory Visit | Attending: Internal Medicine

## 2011-10-06 DIAGNOSIS — R110 Nausea: Secondary | ICD-10-CM | POA: Insufficient documentation

## 2011-10-06 DIAGNOSIS — R112 Nausea with vomiting, unspecified: Secondary | ICD-10-CM

## 2011-10-06 DIAGNOSIS — R1013 Epigastric pain: Secondary | ICD-10-CM | POA: Insufficient documentation

## 2011-10-06 DIAGNOSIS — R141 Gas pain: Secondary | ICD-10-CM

## 2011-10-06 DIAGNOSIS — R197 Diarrhea, unspecified: Secondary | ICD-10-CM | POA: Insufficient documentation

## 2011-10-06 DIAGNOSIS — R142 Eructation: Secondary | ICD-10-CM

## 2011-10-06 DIAGNOSIS — R143 Flatulence: Secondary | ICD-10-CM

## 2011-10-06 HISTORY — PX: ESOPHAGOGASTRODUODENOSCOPY: SHX5428

## 2011-10-06 SURGERY — EGD (ESOPHAGOGASTRODUODENOSCOPY)
Anesthesia: Moderate Sedation

## 2011-10-06 MED ORDER — BUTAMBEN-TETRACAINE-BENZOCAINE 2-2-14 % EX AERO
INHALATION_SPRAY | CUTANEOUS | Status: DC | PRN
  Administered 2011-10-06: 2 via TOPICAL

## 2011-10-06 MED ORDER — MEPERIDINE HCL 50 MG/ML IJ SOLN
INTRAMUSCULAR | Status: AC
  Filled 2011-10-06: qty 1

## 2011-10-06 MED ORDER — MIDAZOLAM HCL 5 MG/5ML IJ SOLN
INTRAMUSCULAR | Status: AC
  Filled 2011-10-06: qty 10

## 2011-10-06 MED ORDER — MEPERIDINE HCL 25 MG/ML IJ SOLN
INTRAMUSCULAR | Status: DC | PRN
  Administered 2011-10-06: 20 mg via INTRAVENOUS
  Administered 2011-10-06: 30 mg via INTRAVENOUS

## 2011-10-06 MED ORDER — STERILE WATER FOR IRRIGATION IR SOLN
Status: DC | PRN
  Administered 2011-10-06: 10:00:00

## 2011-10-06 MED ORDER — DICYCLOMINE HCL 10 MG PO CAPS: 10.0000 mg | ORAL_CAPSULE | Freq: Two times a day (BID) | ORAL | Status: DC

## 2011-10-06 MED ORDER — MIDAZOLAM HCL 5 MG/5ML IJ SOLN
INTRAMUSCULAR | Status: DC | PRN
  Administered 2011-10-06 (×5): 2 mg via INTRAVENOUS

## 2011-10-06 NOTE — H&P (Signed)
Tina Ramirez is an 36 y.o. female.   Chief Complaint: Patient is here for diagnostic esophagogastroduodenoscopy. HPI: This 36 year old Caucasian female who carries diagnoses of celiac disease but did not respond to gluten-free diet. She has postprandial nausea and fullness and does not feel well every time she eats her food. She was recently hospitalized for C. difficile colitis and has fully recovered. She is not having normal bowel movements. Patient has been on regular diet for the last 2 weeks. He is undergoing EGD with biopsy to determine if she has celiac disease. Details of her history can be found in my recent history and physical.  Past Medical History  Diagnosis Date  . Celiac disease     Past Surgical History  Procedure Date  . Cesarean section     x 2  . Hand surgery     right x 2     Family History  Problem Relation Age of Onset  . Asthma Son    Social History:  reports that she has never smoked. She has never used smokeless tobacco. She reports that she drinks alcohol. She reports that she does not use illicit drugs.  Allergies:  Allergies  Allergen Reactions  . Gluten Other (See Comments)    Celiac Disease     Medications Prior to Admission  Medication Sig Dispense Refill  . Flora-Q (FLORA-Q) CAPS Take 1 capsule by mouth daily.  30 capsule  0  . pantoprazole (PROTONIX) 40 MG injection Take 40 mg by mouth at bedtime.        No results found for this or any previous visit (from the past 48 hour(s)). No results found.  ROS  Blood pressure 99/74, pulse 82, temperature 98.6 F (37 C), temperature source Oral, resp. rate 22, last menstrual period 09/18/2011, SpO2 98.00%. Physical Exam   Assessment/Plan ? Celiac disease. Postprandial nausea and epigastric fullness. EGD.  Maud Rubendall U 10/06/2011, 9:31 AM

## 2011-10-06 NOTE — Op Note (Signed)
EGD PROCEDURE REPORT  PATIENT:  Tina Ramirez  MR#:  [id number] Birthdate:  Sep 24, 1975, 36 y.o., female Endoscopist:  Dr. Malissa Hippo, MD Referred By:  Dr. Lilyan Punt, MD Procedure Date: 10/06/2011  Procedure:   EGD  Indications:  Patient is 36 year old Caucasian female with a recurrent postprandial nausea and epigastric pain intermittent diarrhea. She's been diagnosed with celiac disease but did not respond to gluten-free diet despite being very compliant. She has rechallenged with gluten and undergoing diagnostic EGD.           Informed Consent:  The risks, benefits, alternatives & imponderables which include, but are not limited to, bleeding, infection, perforation, drug reaction and potential missed lesion have been reviewed.  The potential for biopsy, lesion removal, esophageal dilation, etc. have also been discussed.  Questions have been answered.  All parties agreeable.  Please see history & physical in medical record for more information.  Medications:  Demerol 30 mg IV Versed 10 mg IV Cetacaine spray topically for oropharyngeal anesthesia  Description of procedure:  The endoscope was introduced through the mouth and advanced to the second portion of the duodenum without difficulty or limitations. The mucosal surfaces were surveyed very carefully during advancement of the scope and upon withdrawal.  Findings:  Esophagus:  Mucosa of the esophagus was normal. Unremarkable GE junction. GEJ:  36 cm Stomach:  Stomach was empty and distended very well with insufflation. Folds in the proximal stomach were normal. Examination of mucosa at body, antrum, pyloric channel, angularis, fundus and cardia was normal Duodenum:  Normal bulbar and post bulbar mucosa. Multiple biopsies taken from second and third part of the duodenum for routine histology.  Therapeutic/Diagnostic Maneuvers Performed:  see above  Complications:  None  Impression: Normal esophagogastroduodenoscopy. Duodenal  biopsy taken to rule out celiac disease.  Recommendations:  Continue pantoprazole at 40 mg by mouth every morning. Dicyclomine 10 mg by mouth before breakfast and lunch daily. I will contact patient with results of biopsy and further recommendations.  Darlinda Bellows U  10/06/2011  9:58 AM  CC: Dr. Lilyan Punt, MD & Dr. Bonnetta Barry ref. provider found

## 2011-10-06 NOTE — Discharge Instructions (Signed)
Resume usual diet and medications. New medication is dicyclomine 10 mg by mouth 30 minutes before breakfast and lunch daily No driving for 24 hours. Physician will contact you with biopsy results  Endoscopy Care After Please read the instructions outlined below and refer to this sheet in the next few weeks. These discharge instructions provide you with general information on caring for yourself after you leave the hospital. Your doctor may also give you specific instructions. While your treatment has been planned according to the most current medical practices available, unavoidable complications occasionally occur. If you have any problems or questions after discharge, please call your doctor. HOME CARE INSTRUCTIONS Activity  You may resume your regular activity but move at a slower pace for the next 24 hours.   Take frequent rest periods for the next 24 hours.   Walking will help expel (get rid of) the air and reduce the bloated feeling in your abdomen.   No driving for 24 hours (because of the anesthesia (medicine) used during the test).   You may shower.   Do not sign any important legal documents or operate any machinery for 24 hours (because of the anesthesia used during the test).  Nutrition  Drink plenty of fluids.   You may resume your normal diet.   Begin with a light meal and progress to your normal diet.   Avoid alcoholic beverages for 24 hours or as instructed by your caregiver.  Medications You may resume your normal medications unless your caregiver tells you otherwise. What you can expect today  You may experience abdominal discomfort such as a feeling of fullness or "gas" pains.   You may experience a sore throat for 2 to 3 days. This is normal. Gargling with salt water may help this.  Follow-up Your doctor will discuss the results of your test with you. SEEK IMMEDIATE MEDICAL CARE IF:  You have excessive nausea (feeling sick to your stomach) and/or vomiting.    You have severe abdominal pain and distention (swelling).   You have trouble swallowing.   You have a temperature over 100 F (37.8 C).   You have rectal bleeding or vomiting of blood.  Document Released: 11/18/2003 Document Revised: 03/25/2011 Document Reviewed: 05/31/2007 [facility] Patient Information 2012 [place], [place].

## 2011-10-11 ENCOUNTER — Encounter (INDEPENDENT_AMBULATORY_CARE_PROVIDER_SITE_OTHER): Payer: Self-pay | Admitting: *Deleted

## 2011-10-11 ENCOUNTER — Encounter (HOSPITAL_COMMUNITY): Payer: Self-pay | Admitting: Internal Medicine

## 2011-11-24 ENCOUNTER — Encounter (INDEPENDENT_AMBULATORY_CARE_PROVIDER_SITE_OTHER): Payer: Self-pay | Admitting: *Deleted

## 2012-01-04 ENCOUNTER — Ambulatory Visit (INDEPENDENT_AMBULATORY_CARE_PROVIDER_SITE_OTHER): Payer: BC Managed Care – PPO | Admitting: Internal Medicine

## 2012-02-07 ENCOUNTER — Ambulatory Visit (INDEPENDENT_AMBULATORY_CARE_PROVIDER_SITE_OTHER): Payer: BC Managed Care – PPO | Admitting: Internal Medicine

## 2012-02-14 ENCOUNTER — Ambulatory Visit (INDEPENDENT_AMBULATORY_CARE_PROVIDER_SITE_OTHER): Payer: BC Managed Care – PPO | Admitting: Internal Medicine

## 2012-07-25 ENCOUNTER — Telehealth: Payer: Self-pay | Admitting: Family Medicine

## 2012-07-25 NOTE — Telephone Encounter (Signed)
Nurse: 8:41AM Pharm: [facility]  Message:patient is  using medicine for sleep aid which is not working - patient was advised by Eber Jones to contact her if rx is not working for another prescription.

## 2012-07-25 NOTE — Telephone Encounter (Signed)
There are not many great options for insomnia. 40% of adult suffer with this despite medications. She could try Ambien 10 mg at nighttime or could increase Ativan to 1 mg at nighttime. Another option is Seroquel but that has some associations with elevated sugar and weight gain. I can authorize refills or prescription of one of the above meds please discuss with her.

## 2012-07-25 NOTE — Telephone Encounter (Signed)
Given trial of Ativan 0.5mg  half to one tab at bedtime for sleep on 06/19/12. Was also started on Prozac 20mg  daily.

## 2012-07-25 NOTE — Telephone Encounter (Signed)
Med called into [facility] per doctors order. Patient aware.

## 2012-07-25 NOTE — Telephone Encounter (Signed)
Tell patient to discontinue ativan at night. Try ambien 5 mg , 1 qhs prn, #30 2 refills. rec devote 8 hours for sleep when using

## 2012-07-25 NOTE — Telephone Encounter (Signed)
May refill also patient has to schedule ov in later april

## 2012-07-25 NOTE — Telephone Encounter (Signed)
Patient states she was already taking 2 to 3 tablets of the Ativan without success. Patient states that going to sleep is not the problem , she wakes up after a few hrs and cant go back to sleep.  She wants to try Ambien because she said she has to get some more sleep.

## 2012-08-02 ENCOUNTER — Ambulatory Visit (INDEPENDENT_AMBULATORY_CARE_PROVIDER_SITE_OTHER): Payer: BC Managed Care – PPO | Admitting: Nurse Practitioner

## 2012-08-02 ENCOUNTER — Encounter: Payer: Self-pay | Admitting: Nurse Practitioner

## 2012-08-02 VITALS — BP 119/90 | Temp 97.9°F | Ht 61.0 in | Wt 101.2 lb

## 2012-08-02 DIAGNOSIS — G4723 Circadian rhythm sleep disorder, irregular sleep wake type: Secondary | ICD-10-CM

## 2012-08-02 DIAGNOSIS — F489 Nonpsychotic mental disorder, unspecified: Secondary | ICD-10-CM

## 2012-08-02 DIAGNOSIS — F5105 Insomnia due to other mental disorder: Secondary | ICD-10-CM

## 2012-08-02 DIAGNOSIS — R5381 Other malaise: Secondary | ICD-10-CM

## 2012-08-02 DIAGNOSIS — F419 Anxiety disorder, unspecified: Secondary | ICD-10-CM

## 2012-08-02 DIAGNOSIS — F411 Generalized anxiety disorder: Secondary | ICD-10-CM

## 2012-08-02 DIAGNOSIS — G479 Sleep disorder, unspecified: Secondary | ICD-10-CM

## 2012-08-02 LAB — BASIC METABOLIC PANEL
BUN: 13 mg/dL (ref 6–23)
CO2: 27 mEq/L (ref 19–32)
Calcium: 9.9 mg/dL (ref 8.4–10.5)
Chloride: 106 mEq/L (ref 96–112)
Creat: 0.83 mg/dL (ref 0.50–1.10)

## 2012-08-02 MED ORDER — TEMAZEPAM 15 MG PO CAPS: ORAL_CAPSULE | ORAL | Status: DC

## 2012-08-02 MED ORDER — TRAZODONE HCL 50 MG PO TABS: ORAL_TABLET | ORAL | Status: DC

## 2012-08-02 NOTE — Assessment & Plan Note (Addendum)
See plan for anxiety. Obtain TSH.

## 2012-08-02 NOTE — Assessment & Plan Note (Signed)
Increase clear fluid intake. Continue Prozac as directed. Add temazepam and trazodone as directed at bedtime. Call back in 48 hours if no significant improvement in sleep, sooner if needed. DC med and call if any problems. Has not been diagnosed with bipolar disorder, although this is unlikely a manic phase of bipolar, still consider this a possibility. Recheck in one month.

## 2012-08-02 NOTE — Patient Instructions (Signed)

## 2012-08-02 NOTE — Addendum Note (Signed)
Addended by: Shelbie Ammons on: 08/02/2012 02:22 PM   Modules accepted: Orders

## 2012-08-02 NOTE — Progress Notes (Signed)
Subjective:  Presents for complaints of significant insomnia for the past 4 days. Has taken 2 Ambien 5 mg with no relief. Averaging about 2 hours of sleep per night, usually awake from 12:00 and cannot go back to sleep. As a result has had some secondary headaches agitation and emotional lability. No caffeine intake. No OTC supplements or meds. Stress level is unchanged. Currently on Prozac 20 mg daily. Also minimal relief with Ativan 0.5 mg. Has not identified any specific triggers for her insomnia.  Objective:   BP 119/90  Temp(Src) 97.9 F (36.6 C)  Ht 5\' 1"  (1.549 m)  Wt 101 lb 3.2 oz (45.904 kg)  BMI 19.13 kg/m2  LMP 07/19/2012 NAD. Alert, oriented. Fatigued in appearance. Crying some during office visit. Lungs clear. Heart regular rate rhythm. Thyroid slight fine nodularity, no masses or swelling, nontender.

## 2012-08-03 ENCOUNTER — Encounter: Payer: Self-pay | Admitting: Family Medicine

## 2012-08-03 LAB — TSH: TSH: 0.683 u[IU]/mL (ref 0.350–4.500)

## 2012-09-04 ENCOUNTER — Telehealth: Payer: Self-pay | Admitting: Family Medicine

## 2012-09-04 NOTE — Telephone Encounter (Signed)
Pt needs refill on her valacyclovir 500 mg HCL tab to [facility]

## 2012-09-04 NOTE — Telephone Encounter (Signed)
She may have refills. Try to clarify with her how she takes this. Thank you.

## 2012-09-04 NOTE — Telephone Encounter (Signed)
Needs refill on acyclovir,seen in office 06/19/12

## 2012-09-04 NOTE — Telephone Encounter (Signed)
Talked with Mardelle Matte at [facility] and patient already had 4 refills on this med. Patient was notified.

## 2012-09-19 ENCOUNTER — Ambulatory Visit (INDEPENDENT_AMBULATORY_CARE_PROVIDER_SITE_OTHER): Payer: BC Managed Care – PPO | Admitting: Family Medicine

## 2012-09-19 ENCOUNTER — Encounter: Payer: Self-pay | Admitting: Family Medicine

## 2012-09-19 VITALS — BP 100/80 | Temp 98.8°F | Ht 61.0 in | Wt 101.5 lb

## 2012-09-19 DIAGNOSIS — J069 Acute upper respiratory infection, unspecified: Secondary | ICD-10-CM

## 2012-09-19 MED ORDER — AZITHROMYCIN 250 MG PO TABS: ORAL_TABLET | ORAL | Status: DC

## 2012-09-19 NOTE — Progress Notes (Signed)
  Subjective:    Patient ID: Tina Ramirez, female    DOB: 1975-09-14, 37 y.o.   MRN: [id number]  Cough This is a new problem. The current episode started in the past 7 days. The problem has been unchanged. The problem occurs constantly. The cough is non-productive. Associated symptoms include a sore throat. Nothing aggravates the symptoms. Treatments tried: tylenol. The treatment provided no relief.   Some postnasal drainage. No wheezing or difficulty breathing. Past medical history benign family history noncontributory social doesn't smoke   Review of Systems  HENT: Positive for sore throat.   Respiratory: Positive for cough.        Objective:   Physical Exam Eardrums normal throat is normal neck supple lungs clear heart regular extremities no edema skin warm dry       Assessment & Plan:  URI-to be viral. Gave her prescription for Zithromax to fill in a couple days if not doing better should gradually get well no need for any other testing currently

## 2012-09-20 ENCOUNTER — Telehealth: Payer: Self-pay | Admitting: Family Medicine

## 2012-09-20 MED ORDER — BENZONATATE 100 MG PO CAPS: 100.0000 mg | ORAL_CAPSULE | Freq: Three times a day (TID) | ORAL | Status: DC | PRN

## 2012-09-20 NOTE — Telephone Encounter (Signed)
Tina Ramirez 100 #24 one q 6 prn cough

## 2012-09-20 NOTE — Telephone Encounter (Signed)
Nurse/Dr. Lorin Picket  RX request: Tina Ramirez had an office visit on 09/19/12. Her cough is still present and getting no better, would like to know if she could have a prescription called in for her cough.  Pharmacy: [facility]

## 2012-09-20 NOTE — Telephone Encounter (Signed)
Med sent electronically to [facility]. Patient notified. 

## 2012-10-02 ENCOUNTER — Other Ambulatory Visit: Payer: Self-pay | Admitting: Nurse Practitioner

## 2012-10-02 NOTE — Telephone Encounter (Signed)
May refill of temazepam twice. Trazodone may be refilled 6 refills.

## 2012-10-03 NOTE — Telephone Encounter (Signed)
RX called into pharmacy

## 2012-10-16 ENCOUNTER — Other Ambulatory Visit: Payer: Self-pay | Admitting: Nurse Practitioner

## 2012-10-16 ENCOUNTER — Telehealth: Payer: Self-pay | Admitting: Family Medicine

## 2012-10-16 MED ORDER — FLUCONAZOLE 150 MG PO TABS: ORAL_TABLET | ORAL | Status: DC

## 2012-10-16 NOTE — Telephone Encounter (Signed)
Nurse/Scott  Refill request for Dyflucan Pharm: [facility]

## 2012-10-17 ENCOUNTER — Encounter: Payer: Self-pay | Admitting: *Deleted

## 2013-01-03 ENCOUNTER — Other Ambulatory Visit: Payer: Self-pay | Admitting: Nurse Practitioner

## 2013-01-03 ENCOUNTER — Telehealth: Payer: Self-pay | Admitting: Family Medicine

## 2013-01-03 MED ORDER — FLUCONAZOLE 150 MG PO TABS: ORAL_TABLET | ORAL | Status: DC

## 2013-01-03 NOTE — Telephone Encounter (Signed)
Pt would like diflucan x2 for her yeast infection sent to [facility]

## 2013-02-07 ENCOUNTER — Encounter: Payer: Self-pay | Admitting: Nurse Practitioner

## 2013-02-07 ENCOUNTER — Ambulatory Visit (INDEPENDENT_AMBULATORY_CARE_PROVIDER_SITE_OTHER): Payer: BC Managed Care – PPO | Admitting: Nurse Practitioner

## 2013-02-07 ENCOUNTER — Encounter: Payer: Self-pay | Admitting: Family Medicine

## 2013-02-07 VITALS — BP 102/68 | Temp 98.9°F | Ht 61.0 in | Wt 106.0 lb

## 2013-02-07 DIAGNOSIS — J029 Acute pharyngitis, unspecified: Secondary | ICD-10-CM

## 2013-02-07 DIAGNOSIS — J069 Acute upper respiratory infection, unspecified: Secondary | ICD-10-CM

## 2013-02-07 DIAGNOSIS — L989 Disorder of the skin and subcutaneous tissue, unspecified: Secondary | ICD-10-CM

## 2013-02-07 MED ORDER — AZITHROMYCIN 250 MG PO TABS: ORAL_TABLET | ORAL | Status: DC

## 2013-02-11 ENCOUNTER — Encounter: Payer: Self-pay | Admitting: Nurse Practitioner

## 2013-02-11 NOTE — Progress Notes (Signed)
Subjective:  Presents complaints of sore throat cough and fever for the past 4 days. No wheezing. No headache. Some fatigue and body aches. No ear pain. No vomiting diarrhea or abdominal pain. Frequent cough, mainly nonproductive. Her son is also sick. Also mentions a lesion on her right lower leg for the past 2 years. Will open up into a shallow sore and then heal over.  Objective:   BP 102/68  Temp(Src) 98.9 F (37.2 C) (Oral)  Ht 5\' 1"  (1.549 m)  Wt 106 lb (48.081 kg)  BMI 20.04 kg/m2 NAD. Alert, oriented. TMs clear effusion, no erythema. Pharynx mildly injected. RST negative. Neck supple with mild soft nontender adenopathy. Lungs clear. Heart regular rate rhythm. Frequent nonproductive cough noted. A dry slightly raised scaly dark lesion noted on the right lower leg, nontender.  Assessment:Acute pharyngitis - Plan: Strep A DNA probe, POCT rapid strep A  Acute upper respiratory infections of unspecified site  Skin lesion of right leg - Plan: Ambulatory referral to Dermatology   Plan: Meds ordered this encounter  Medications  . azithromycin (ZITHROMAX Z-PAK) 250 MG tablet    Sig: Take 2 tablets (500 mg) on  Day 1,  followed by 1 tablet (250 mg) once daily on Days 2 through 5.    Dispense:  6 each    Refill:  0    Order Specific Question:  Supervising Provider    Answer:  Merlyn Albert [[id number]]   OTC meds as directed for congestion. Call back if worsens or persists.

## 2013-03-19 ENCOUNTER — Encounter: Payer: Self-pay | Admitting: Family Medicine

## 2013-04-09 ENCOUNTER — Other Ambulatory Visit: Payer: Self-pay | Admitting: Nurse Practitioner

## 2013-04-09 ENCOUNTER — Telehealth: Payer: Self-pay | Admitting: Family Medicine

## 2013-04-09 MED ORDER — METRONIDAZOLE 500 MG PO TABS: 500.0000 mg | ORAL_TABLET | Freq: Two times a day (BID) | ORAL | Status: DC

## 2013-04-09 NOTE — Telephone Encounter (Signed)
Patient is hoping to get this done today ASAP and please call when complete.

## 2013-04-09 NOTE — Telephone Encounter (Signed)
Patient is hoping to have flagyl called in for a bacterial infection   [facility]

## 2013-04-13 ENCOUNTER — Telehealth: Payer: Self-pay | Admitting: Family Medicine

## 2013-04-13 MED ORDER — FLUCONAZOLE 150 MG PO TABS: ORAL_TABLET | ORAL | Status: DC

## 2013-04-13 NOTE — Telephone Encounter (Signed)
1 tab po times one, 2 reflls,150 mg ( one q week prn)

## 2013-04-13 NOTE — Telephone Encounter (Signed)
Med sent to pharm. Pt notified.  

## 2013-04-13 NOTE — Telephone Encounter (Signed)
Patient would like Rx for diflucan for vaginal issue Please call when complete.  [facility]

## 2013-04-17 ENCOUNTER — Telehealth: Payer: Self-pay | Admitting: Family Medicine

## 2013-04-17 NOTE — Telephone Encounter (Signed)
plz change to accomodate pt

## 2013-04-17 NOTE — Telephone Encounter (Signed)
Med called into [facility]. Patient notified.

## 2013-04-17 NOTE — Telephone Encounter (Signed)
Patient says that the first diflucan did not help. She states that Mardelle Matte at [facility] told her that normally after 3 days if the problem has not cleared up then they could fill it, but the way it is written she cannot get it filled again for another week. She is hoping we can fix this to where she can go ahead and get 2nd round of diflucan filled.   [facility]

## 2013-05-03 ENCOUNTER — Other Ambulatory Visit: Payer: Self-pay | Admitting: Nurse Practitioner

## 2013-05-04 NOTE — Telephone Encounter (Signed)
Okay x3 

## 2013-05-09 ENCOUNTER — Other Ambulatory Visit: Payer: Self-pay | Admitting: Nurse Practitioner

## 2013-05-22 ENCOUNTER — Ambulatory Visit (INDEPENDENT_AMBULATORY_CARE_PROVIDER_SITE_OTHER): Payer: BC Managed Care – PPO | Admitting: Family Medicine

## 2013-05-22 ENCOUNTER — Encounter: Payer: Self-pay | Admitting: Family Medicine

## 2013-05-22 VITALS — BP 110/74 | Temp 98.9°F | Ht 61.0 in | Wt 108.0 lb

## 2013-05-22 DIAGNOSIS — J019 Acute sinusitis, unspecified: Secondary | ICD-10-CM

## 2013-05-22 MED ORDER — FLUCONAZOLE 150 MG PO TABS: 150.0000 mg | ORAL_TABLET | Freq: Once | ORAL | Status: DC

## 2013-05-22 MED ORDER — CEFPROZIL 500 MG PO TABS: 500.0000 mg | ORAL_TABLET | Freq: Two times a day (BID) | ORAL | Status: DC

## 2013-05-22 MED ORDER — BENZONATATE 100 MG PO CAPS: 100.0000 mg | ORAL_CAPSULE | Freq: Four times a day (QID) | ORAL | Status: DC | PRN

## 2013-05-22 NOTE — Progress Notes (Signed)
   Subjective:    Patient ID: Tina Ramirez, female    DOB: May 17, 1975, 38 y.o.   MRN: [id number]  Cough This is a new problem. The current episode started in the past 7 days. Associated symptoms include rhinorrhea and a sore throat. Pertinent negatives include no chest pain, ear pain, fever, shortness of breath or wheezing. Associated symptoms comments: Chest congestion.   PMH benign  Review of Systems  Constitutional: Negative for fever and activity change.  HENT: Positive for congestion, rhinorrhea and sore throat. Negative for ear pain.   Eyes: Negative for discharge.  Respiratory: Positive for cough. Negative for shortness of breath and wheezing.   Cardiovascular: Negative for chest pain.       Objective:   Physical Exam  Nursing note and vitals reviewed. Constitutional: She appears well-developed.  HENT:  Head: Normocephalic.  Nose: Nose normal.  Mouth/Throat: Oropharynx is clear and moist. No oropharyngeal exudate.  Neck: Neck supple.  Cardiovascular: Normal rate and normal heart sounds.   No murmur heard. Pulmonary/Chest: Effort normal and breath sounds normal. She has no wheezes.  Lymphadenopathy:    She has no cervical adenopathy.  Skin: Skin is warm and dry.          Assessment & Plan:  Viral syndrome with secondary sinusitis antibiotics prescribed followup ongoing trouble

## 2013-06-21 ENCOUNTER — Other Ambulatory Visit: Payer: Self-pay | Admitting: Family Medicine

## 2013-07-10 ENCOUNTER — Encounter: Payer: Self-pay | Admitting: Family Medicine

## 2013-07-10 ENCOUNTER — Ambulatory Visit (INDEPENDENT_AMBULATORY_CARE_PROVIDER_SITE_OTHER): Payer: BC Managed Care – PPO | Admitting: Family Medicine

## 2013-07-10 VITALS — BP 110/68 | Ht 61.0 in | Wt 104.1 lb

## 2013-07-10 DIAGNOSIS — F419 Anxiety disorder, unspecified: Secondary | ICD-10-CM

## 2013-07-10 DIAGNOSIS — F411 Generalized anxiety disorder: Secondary | ICD-10-CM

## 2013-07-10 DIAGNOSIS — R5381 Other malaise: Secondary | ICD-10-CM

## 2013-07-10 DIAGNOSIS — R5383 Other fatigue: Principal | ICD-10-CM

## 2013-07-10 DIAGNOSIS — F5105 Insomnia due to other mental disorder: Secondary | ICD-10-CM

## 2013-07-10 DIAGNOSIS — F489 Nonpsychotic mental disorder, unspecified: Secondary | ICD-10-CM

## 2013-07-10 MED ORDER — ALPRAZOLAM 0.5 MG PO TABS: 0.5000 mg | ORAL_TABLET | Freq: Two times a day (BID) | ORAL | Status: DC | PRN

## 2013-07-10 MED ORDER — FLUOXETINE HCL 20 MG PO TABS: ORAL_TABLET | ORAL | Status: DC

## 2013-07-10 MED ORDER — ZOLPIDEM TARTRATE 5 MG PO TABS: 5.0000 mg | ORAL_TABLET | Freq: Every evening | ORAL | Status: DC | PRN

## 2013-07-10 NOTE — Progress Notes (Signed)
   Subjective:    Patient ID: Tina Ramirez, female    DOB: Jan 14, 1976, 38 y.o.   MRN: [id number]  HPI Patient is hee today because she has been experiencing depression, insomnia and panic attacks. This has been present for about 1 month now. Symptoms are worsening. Currently taking Prozac for her depression with no relief. No other concerns at this time.  Greater than 25 minutes was spent discussing with patient Not suicidal Stressed a lot  Review of Systems See above    Objective:   Physical Exam  Lungs clear hearts regular pulse normal      Assessment & Plan:  Patient with significant stress fatigue she is feeling pressed upon rundown. We increased her Prozac. Xanax as needed for nervousness Ambien for nighttime use when necessary cautioned drowsiness, patient was warned not to use Xanax frequently was warned about drowsiness she is to followup in 2-3 weeks' time to see how she is doing we also recommended counseling she states that she will think about it and discuss it further upon her followup. She denies being suicidal or homicidal. She relates that she will follow up here immediately if any particular problems

## 2013-09-11 ENCOUNTER — Telehealth: Payer: Self-pay | Admitting: Family Medicine

## 2013-09-11 NOTE — Telephone Encounter (Signed)
Patient stated she would call back later.

## 2013-09-11 NOTE — Telephone Encounter (Signed)
Please call and schedule appt. She needs appt first.

## 2013-09-11 NOTE — Telephone Encounter (Signed)
Patient would like a referral to a dermatologist because of a painful circle spot on her leg. Does she need to be seen?

## 2013-09-24 ENCOUNTER — Other Ambulatory Visit: Payer: Self-pay

## 2013-10-03 ENCOUNTER — Encounter: Payer: Self-pay | Admitting: Family Medicine

## 2013-10-03 ENCOUNTER — Ambulatory Visit (INDEPENDENT_AMBULATORY_CARE_PROVIDER_SITE_OTHER): Payer: BC Managed Care – PPO | Admitting: Family Medicine

## 2013-10-03 VITALS — BP 100/72 | Temp 98.5°F | Ht 61.0 in | Wt 102.0 lb

## 2013-10-03 DIAGNOSIS — J329 Chronic sinusitis, unspecified: Secondary | ICD-10-CM

## 2013-10-03 MED ORDER — CEFDINIR 300 MG PO CAPS: 300.0000 mg | ORAL_CAPSULE | Freq: Two times a day (BID) | ORAL | Status: DC

## 2013-10-03 MED ORDER — ONDANSETRON 4 MG PO TBDP: 4.0000 mg | ORAL_TABLET | Freq: Three times a day (TID) | ORAL | Status: DC | PRN

## 2013-10-03 NOTE — Progress Notes (Signed)
   Subjective:    Patient ID: Tina Ramirez, female    DOB: 1975-08-12, 38 y.o.   MRN: [id number]  Fever  This is a new problem. Episode onset: Sunday. The problem occurs intermittently. Her temperature was unmeasured prior to arrival. Associated symptoms include diarrhea, muscle aches, nausea, sleepiness, a sore throat and vomiting. She has tried acetaminophen for the symptoms. The treatment provided mild relief.   Diminished energy no sig cough  vom sevdral times  Energy zapped last few d, no appetite  Minimal cough.  Headache frontal in nature.   Review of Systems  Constitutional: Positive for fever.  HENT: Positive for sore throat.   Gastrointestinal: Positive for nausea, vomiting and diarrhea.       Objective:   Physical Exam  Alert mild malaise. Pharynx slight erythema neck supple. Lungs clear. Heart regular rhythm H&T moderate his congestion      Assessment & Plan:  Impression viral syndrome with secondary sinusitis/pharyngitis plan antibiotics prescribed. Symptomatic care discussed. WSL

## 2013-10-05 ENCOUNTER — Encounter: Payer: Self-pay | Admitting: Family Medicine

## 2013-10-08 ENCOUNTER — Emergency Department (HOSPITAL_COMMUNITY)
Admission: EM | Admit: 2013-10-08 | Discharge: 2013-10-08 | Disposition: A | Discharge status: Home / Self Care | Payer: BC Managed Care – PPO | Attending: Emergency Medicine | Admitting: Emergency Medicine

## 2013-10-08 ENCOUNTER — Encounter (HOSPITAL_COMMUNITY): Payer: Self-pay | Admitting: Emergency Medicine

## 2013-10-08 DIAGNOSIS — R509 Fever, unspecified: Secondary | ICD-10-CM | POA: Insufficient documentation

## 2013-10-08 DIAGNOSIS — Z9889 Other specified postprocedural states: Secondary | ICD-10-CM | POA: Insufficient documentation

## 2013-10-08 DIAGNOSIS — Z792 Long term (current) use of antibiotics: Secondary | ICD-10-CM | POA: Insufficient documentation

## 2013-10-08 DIAGNOSIS — R638 Other symptoms and signs concerning food and fluid intake: Secondary | ICD-10-CM | POA: Insufficient documentation

## 2013-10-08 DIAGNOSIS — Z3202 Encounter for pregnancy test, result negative: Secondary | ICD-10-CM | POA: Insufficient documentation

## 2013-10-08 DIAGNOSIS — Z79899 Other long term (current) drug therapy: Secondary | ICD-10-CM | POA: Insufficient documentation

## 2013-10-08 DIAGNOSIS — R5381 Other malaise: Secondary | ICD-10-CM | POA: Insufficient documentation

## 2013-10-08 DIAGNOSIS — R197 Diarrhea, unspecified: Secondary | ICD-10-CM | POA: Insufficient documentation

## 2013-10-08 DIAGNOSIS — R112 Nausea with vomiting, unspecified: Secondary | ICD-10-CM

## 2013-10-08 DIAGNOSIS — R5383 Other fatigue: Secondary | ICD-10-CM

## 2013-10-08 LAB — CBC WITH DIFFERENTIAL/PLATELET
Basophils Absolute: 0 10*3/uL (ref 0.0–0.1)
Basophils Relative: 0 % (ref 0–1)
EOS ABS: 0.2 10*3/uL (ref 0.0–0.7)
Eosinophils Relative: 4 % (ref 0–5)
HCT: 37.8 % (ref 36.0–46.0)
Hemoglobin: 13.1 g/dL (ref 12.0–15.0)
LYMPHS PCT: 39 % (ref 12–46)
Lymphs Abs: 1.8 10*3/uL (ref 0.7–4.0)
MCH: 32.7 pg (ref 26.0–34.0)
MCHC: 34.7 g/dL (ref 30.0–36.0)
MCV: 94.3 fL (ref 78.0–100.0)
MONOS PCT: 5 % (ref 3–12)
Monocytes Absolute: 0.2 10*3/uL (ref 0.1–1.0)
NEUTROS ABS: 2.4 10*3/uL (ref 1.7–7.7)
NEUTROS PCT: 52 % (ref 43–77)
PLATELETS: 224 10*3/uL (ref 150–400)
RBC: 4.01 MIL/uL (ref 3.87–5.11)
RDW: 12 % (ref 11.5–15.5)
WBC: 4.7 10*3/uL (ref 4.0–10.5)

## 2013-10-08 LAB — URINE MICROSCOPIC-ADD ON

## 2013-10-08 LAB — PREGNANCY, URINE: Preg Test, Ur: NEGATIVE

## 2013-10-08 LAB — URINALYSIS, ROUTINE W REFLEX MICROSCOPIC
Bilirubin Urine: NEGATIVE
Glucose, UA: NEGATIVE mg/dL
Ketones, ur: NEGATIVE mg/dL
Leukocytes, UA: NEGATIVE
Nitrite: NEGATIVE
PH: 6 (ref 5.0–8.0)
UROBILINOGEN UA: 0.2 mg/dL (ref 0.0–1.0)

## 2013-10-08 LAB — BASIC METABOLIC PANEL
BUN: 10 mg/dL (ref 6–23)
CHLORIDE: 101 meq/L (ref 96–112)
CO2: 27 meq/L (ref 19–32)
Calcium: 9.2 mg/dL (ref 8.4–10.5)
Creatinine, Ser: 0.85 mg/dL (ref 0.50–1.10)
GFR calc Af Amer: >90 mL/min (ref >90)
GFR, EST NON AFRICAN AMERICAN: 87 mL/min — AB (ref >90)
GLUCOSE: 98 mg/dL (ref 70–99)
POTASSIUM: 3.9 meq/L (ref 3.7–5.3)
SODIUM: 140 meq/L (ref 137–147)

## 2013-10-08 MED ORDER — ONDANSETRON HCL 4 MG/2ML IJ SOLN
INTRAMUSCULAR | Status: AC
  Filled 2013-10-08: qty 2

## 2013-10-08 MED ORDER — ONDANSETRON HCL 4 MG/2ML IJ SOLN
4.0000 mg | Freq: Once | INTRAMUSCULAR | Status: DC
  Filled 2013-10-08: qty 2

## 2013-10-08 MED ORDER — SODIUM CHLORIDE 0.9 % IV BOLUS (SEPSIS)
500.0000 mL | Freq: Once | INTRAVENOUS | Status: AC
  Administered 2013-10-08: 500 mL via INTRAVENOUS

## 2013-10-08 MED ORDER — SODIUM CHLORIDE 0.9 % IV BOLUS (SEPSIS)
1000.0000 mL | Freq: Once | INTRAVENOUS | Status: AC
  Administered 2013-10-08: 1000 mL via INTRAVENOUS

## 2013-10-08 MED ORDER — ONDANSETRON HCL 4 MG/2ML IJ SOLN
4.0000 mg | Freq: Once | INTRAMUSCULAR | Status: AC
  Administered 2013-10-08: 4 mg via INTRAVENOUS

## 2013-10-08 MED ORDER — PROMETHAZINE HCL 25 MG PO TABS: 25.0000 mg | ORAL_TABLET | Freq: Four times a day (QID) | ORAL | Status: DC | PRN

## 2013-10-08 NOTE — Discharge Instructions (Signed)
Diarrhea °Diarrhea is frequent loose and watery bowel movements. It can cause you to feel weak and dehydrated. Dehydration can cause you to become tired and thirsty, have a dry mouth, and have decreased urination that often is dark yellow. Diarrhea is a sign of another problem, most often an infection that will not last long. In most cases, diarrhea typically lasts 2-3 days. However, it can last longer if it is a sign of something more serious. It is important to treat your diarrhea as directed by your caregive to lessen or prevent future episodes of diarrhea. °CAUSES  °Some common causes include: °· Gastrointestinal infections caused by viruses, bacteria, or parasites. °· Food poisoning or food allergies. °· Certain medicines, such as antibiotics, chemotherapy, and laxatives. °· Artificial sweeteners and fructose. °· Digestive disorders. °HOME CARE INSTRUCTIONS °· Ensure adequate fluid intake (hydration): have 1 cup (8 oz) of fluid for each diarrhea episode. Avoid fluids that contain simple sugars or sports drinks, fruit juices, whole milk products, and sodas. Your urine should be clear or pale yellow if you are drinking enough fluids. Hydrate with an oral rehydration solution that you can purchase at pharmacies, retail stores, and online. You can prepare an oral rehydration solution at home by mixing the following ingredients together: °¨  - tsp table salt. °¨ ¾ tsp baking soda. °¨  tsp salt substitute containing potassium chloride. °¨ 1  tablespoons sugar. °¨ 1 L (34 oz) of water. °· Certain foods and beverages may increase the speed at which food moves through the gastrointestinal (GI) tract. These foods and beverages should be avoided and include: °¨ Caffeinated and alcoholic beverages. °¨ High-fiber foods, such as raw fruits and vegetables, nuts, seeds, and whole grain breads and cereals. °¨ Foods and beverages sweetened with sugar alcohols, such as xylitol, sorbitol, and mannitol. °· Some foods may be well  tolerated and may help thicken stool including: °¨ Starchy foods, such as rice, toast, pasta, low-sugar cereal, oatmeal, grits, baked potatoes, crackers, and bagels. °¨ Bananas. °¨ Applesauce. °· Add probiotic-rich foods to help increase healthy bacteria in the GI tract, such as yogurt and fermented milk products. °· Wash your hands well after each diarrhea episode. °· Only take over-the-counter or prescription medicines as directed by your caregiver. °· Take a warm bath to relieve any burning or pain from frequent diarrhea episodes. °SEEK IMMEDIATE MEDICAL CARE IF:  °· You are unable to keep fluids down. °· You have persistent vomiting. °· You have blood in your stool, or your stools are black and tarry. °· You do not urinate in 6-8 hours, or there is only a small amount of very dark urine. °· You have abdominal pain that increases or localizes. °· You have weakness, dizziness, confusion, or lightheadedness. °· You have a severe headache. °· Your diarrhea gets worse or does not get better. °· You have a fever or persistent symptoms for more than 2-3 days. °· You have a fever and your symptoms suddenly get worse. °MAKE SURE YOU:  °· Understand these instructions. °· Will watch your condition. °· Will get help right away if you are not doing well or get worse. °Document Released: 03/26/2002 Document Revised: 03/22/2012 Document Reviewed: 12/12/2011 °ExitCare® Patient Information ©2015 [employer]. This information is not intended to replace advice given to you by your health care provider. Make sure you discuss any questions you have with your health care provider. ° °Nausea and Vomiting °Nausea is a sick feeling that often comes before throwing up (vomiting). Vomiting   is a reflex where stomach contents come out of your mouth. Vomiting can cause severe loss of body fluids (dehydration). Children and elderly adults can become dehydrated quickly, especially if they also have diarrhea. Nausea and vomiting are symptoms  of a condition or disease. It is important to find the cause of your symptoms. °CAUSES  °· Direct irritation of the stomach lining. This irritation can result from increased acid production (gastroesophageal reflux disease), infection, food poisoning, taking certain medicines (such as nonsteroidal anti-inflammatory drugs), alcohol use, or tobacco use. °· Signals from the brain. These signals could be caused by a headache, heat exposure, an inner ear disturbance, increased pressure in the brain from injury, infection, a tumor, or a concussion, pain, emotional stimulus, or metabolic problems. °· An obstruction in the gastrointestinal tract (bowel obstruction). °· Illnesses such as diabetes, hepatitis, gallbladder problems, appendicitis, kidney problems, cancer, sepsis, atypical symptoms of a heart attack, or eating disorders. °· Medical treatments such as chemotherapy and radiation. °· Receiving medicine that makes you sleep (general anesthetic) during surgery. °DIAGNOSIS °Your caregiver may ask for tests to be done if the problems do not improve after a few days. Tests may also be done if symptoms are severe or if the reason for the nausea and vomiting is not clear. Tests may include: °· Urine tests. °· Blood tests. °· Stool tests. °· Cultures (to look for evidence of infection). °· X-rays or other imaging studies. °Test results can help your caregiver make decisions about treatment or the need for additional tests. °TREATMENT °You need to stay well hydrated. Drink frequently but in small amounts. You may wish to drink water, sports drinks, clear broth, or eat frozen ice pops or gelatin dessert to help stay hydrated. When you eat, eating slowly may help prevent nausea. There are also some antinausea medicines that may help prevent nausea. °HOME CARE INSTRUCTIONS  °· Take all medicine as directed by your caregiver. °· If you do not have an appetite, do not force yourself to eat. However, you must continue to drink  fluids. °· If you have an appetite, eat a normal diet unless your caregiver tells you differently. °¨ Eat a variety of complex carbohydrates (rice, wheat, potatoes, bread), lean meats, yogurt, fruits, and vegetables. °¨ Avoid high-fat foods because they are more difficult to digest. °· Drink enough water and fluids to keep your urine clear or pale yellow. °· If you are dehydrated, ask your caregiver for specific rehydration instructions. Signs of dehydration may include: °¨ Severe thirst. °¨ Dry lips and mouth. °¨ Dizziness. °¨ Dark urine. °¨ Decreasing urine frequency and amount. °¨ Confusion. °¨ Rapid breathing or pulse. °SEEK IMMEDIATE MEDICAL CARE IF:  °· You have blood or brown flecks (like coffee grounds) in your vomit. °· You have black or bloody stools. °· You have a severe headache or stiff neck. °· You are confused. °· You have severe abdominal pain. °· You have chest pain or trouble breathing. °· You do not urinate at least once every 8 hours. °· You develop cold or clammy skin. °· You continue to vomit for longer than 24 to 48 hours. °· You have a fever. °MAKE SURE YOU:  °· Understand these instructions. °· Will watch your condition. °· Will get help right away if you are not doing well or get worse. °Document Released: 04/05/2005 Document Revised: 06/28/2011 Document Reviewed: 09/02/2010 °ExitCare® Patient Information ©2015 [employer]. This information is not intended to replace advice given to you by your health care provider. Make sure you discuss   any questions you have with your health care provider. ° °

## 2013-10-08 NOTE — ED Notes (Signed)
Pt c/o abd pain with n/v/d x 1 week.  Saw pcp Wednesday and was put on antibiotic but says is no better.  PT also c/o headache.

## 2013-10-08 NOTE — ED Provider Notes (Signed)
CSN: [id number]     Arrival date & time 10/08/13  1039 History  This chart was scribed for American Expressathan R. Rubin PayorPickering, MD by Ardelia Memsylan Malpass, ED Scribe. This patient was seen in room APA16A/[facility]16A and the patient's care was started at 11:20 AM .   Chief Complaint  Patient presents with  . Abdominal Pain    The history is provided by the patient. No language interpreter was used.    HPI Comments: Unice BaileyMary S Tuttle is a 38 y.o. female who presents to the Emergency Department complaining of intermittent, moderate suprapubic abdominal pain over the past week. She reports associated episodes of non-bloody emesis and diarrhea, reduced appetite and generalized weakness over the past week. She states that she saw her PCP for these symptoms 5 days ago and was started on an antibiotic and Zofran which has only helped minimally with her symptoms. She states that when her symptoms began, she had a sore throat and subjective fever, which she states have since resolved. She denies any known sick contacts. She states that she has no medication allergies.    Past Medical History  Diagnosis Date  . Celiac disease     pt says was a misdiagnosis   Past Surgical History  Procedure Laterality Date  . Cesarean section      x 2  . Hand surgery      right x 2   . Esophagogastroduodenoscopy  10/06/2011    Procedure: ESOPHAGOGASTRODUODENOSCOPY (EGD);  Surgeon: Malissa HippoNajeeb U Rehman, MD;  Location: [facility];  Service: Endoscopy;  Laterality: N/A;  310  . Mouth surgery    . Adenoidectomy     Family History  Problem Relation Age of Onset  . Asthma Son    History  Substance Use Topics  . Smoking status: Never Smoker   . Smokeless tobacco: Never Used  . Alcohol Use: Yes     Comment: occasional   OB History   Grav Para Term Preterm Abortions TAB SAB Ect Mult Living                 Review of Systems  Constitutional: Positive for fever (resolved) and appetite change.  HENT: Positive for sore throat (resolved).    Gastrointestinal: Positive for nausea, vomiting, abdominal pain and diarrhea.  Neurological: Positive for weakness (generalized).  All other systems reviewed and are negative.   Allergies  Review of patient's allergies indicates no known allergies.  Home Medications   Prior to Admission medications   Medication Sig Start Date End Date Taking? Authorizing Provider  ALPRAZolam Prudy Feeler(XANAX) 0.5 MG tablet Take 1 tablet (0.5 mg total) by mouth 2 (two) times daily as needed for anxiety or sleep. 07/10/13  Yes Babs SciaraScott A Luking, MD  cefdinir (OMNICEF) 300 MG capsule Take 1 capsule (300 mg total) by mouth 2 (two) times daily. 10/03/13  Yes Merlyn AlbertWilliam S Luking, MD  FLUoxetine (PROZAC) 20 MG capsule Take 20 mg by mouth daily.   Yes Historical Provider, MD  [facility] 0.25-35 MG-MCG tablet Take 1 tablet by mouth at bedtime. 09/27/13  Yes Historical Provider, MD  ondansetron (ZOFRAN ODT) 4 MG disintegrating tablet Take 1 tablet (4 mg total) by mouth every 8 (eight) hours as needed for nausea or vomiting. 10/03/13  Yes Merlyn AlbertWilliam S Luking, MD  promethazine (PHENERGAN) 25 MG tablet Take 1 tablet (25 mg total) by mouth every 6 (six) hours as needed for nausea. 10/08/13   Juliet RudeNathan R. Pickering, MD  valACYclovir (VALTREX) 500 MG tablet Take 1,000 mg by  mouth See admin instructions. Takes for 1 day for fever blister    Historical Provider, MD   Triage Vitals: BP 136/92  Pulse 86  Temp(Src) 98.6 F (37 C) (Oral)  Resp 18  Ht 5\' 1"  (1.549 m)  Wt 105 lb (47.628 kg)  BMI 19.85 kg/m2  SpO2 100%  LMP 10/04/2013  Physical Exam  Nursing note and vitals reviewed. Constitutional: She is oriented to person, place, and time. She appears well-developed and well-nourished. No distress.  HENT:  Head: Normocephalic and atraumatic.  Posterior pharynx with mild erythema, no exudates.   Eyes: Conjunctivae and EOM are normal.  Neck: Neck supple. No tracheal deviation present.  Cardiovascular: Normal rate.   Pulmonary/Chest: Effort  normal. No respiratory distress.  Abdominal: There is tenderness.  Minimal abdominal tenderness. Hyperactive bowel sounds.   Musculoskeletal: Normal range of motion.  Neurological: She is alert and oriented to person, place, and time.  Skin: Skin is warm and dry.  Psychiatric: She has a normal mood and affect. Her behavior is normal.    ED Course  Procedures (including critical care time)  DIAGNOSTIC STUDIES: Oxygen Saturation is 100% on RA, normal by my interpretation.    COORDINATION OF CARE: 11:24 AM- Discussed plan to obtain diagnostic lab work. Will also order IV fluids and Zofran. Pt advised of plan for treatment and pt agrees.  Labs Review Labs Reviewed  BASIC METABOLIC PANEL - Abnormal; Notable for the following:    GFR calc non Af Amer 87 (*)    All other components within normal limits  URINALYSIS, ROUTINE W REFLEX MICROSCOPIC - Abnormal; Notable for the following:    Specific Gravity, Urine >1.030 (*)    Hgb urine dipstick TRACE (*)    Protein, ur TRACE (*)    All other components within normal limits  URINE MICROSCOPIC-ADD ON - Abnormal; Notable for the following:    Squamous Epithelial / LPF MANY (*)    Bacteria, UA MANY (*)    All other components within normal limits  CBC WITH DIFFERENTIAL  PREGNANCY, URINE    Imaging Review No results found.   EKG Interpretation None      MDM   Final diagnoses:  Nausea vomiting and diarrhea    Patient with nausea vomiting diarrhea. Has been treated with antibiotics for sinusitis on top of recent viral syndrome. Patient feels mildly improved and he is tolerated orals. Lab work overall reassuring. Will discharge home  I personally performed the services described in this documentation, which was scribed in my presence. The recorded information has been reviewed and is accurate.   Juliet RudeNathan R. Rubin PayorPickering, MD 10/08/13 1550

## 2013-10-08 NOTE — ED Notes (Signed)
Pt tolerating sprite with no emesis. Does c/o of slight cramping when drinking.

## 2013-10-08 NOTE — ED Notes (Signed)
Report given to Penny  

## 2013-10-08 NOTE — ED Notes (Signed)
Sprite given to pt

## 2013-11-02 ENCOUNTER — Other Ambulatory Visit: Payer: Self-pay | Admitting: Family Medicine

## 2013-11-02 NOTE — Telephone Encounter (Signed)
May refill this +3 additional refills 

## 2013-11-11 IMAGING — CT CT ABD-PELV W/ CM
2 of 4 series · 16 of 46 positions shown, 18 images · IV contrast (Omnipaque 300)
Comparison: 11/06/2009

CLINICAL DATA: Abdominal pain.  Nausea and vomiting.  Diarrhea.

CT ABDOMEN AND PELVIS WITH CONTRAST
TECHNIQUE: Multidetector CT imaging of the abdomen and pelvis was
performed following the standard protocol during bolus
administration of intravenous contrast.
Contrast: 100mL OMNIPAQUE IOHEXOL 300 MG/ML  SOLN

[Series 2: abd_pel_with 5.0 b40f · axial · 0.54mm/px · z∈[-462,-57]mm · 13 of 89 slices shown, 15 images]
[im 4/89  soft-tissue]
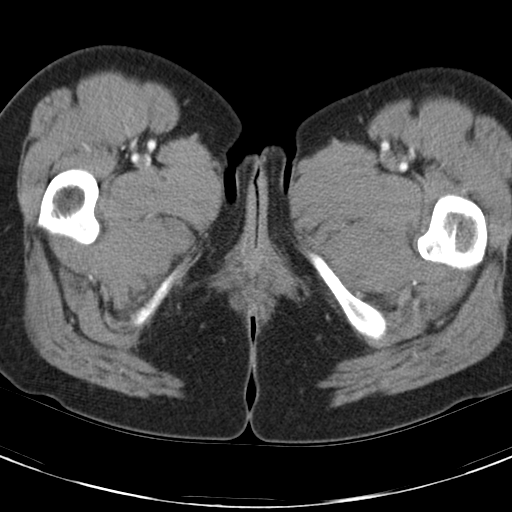
[im 4/89  bone]
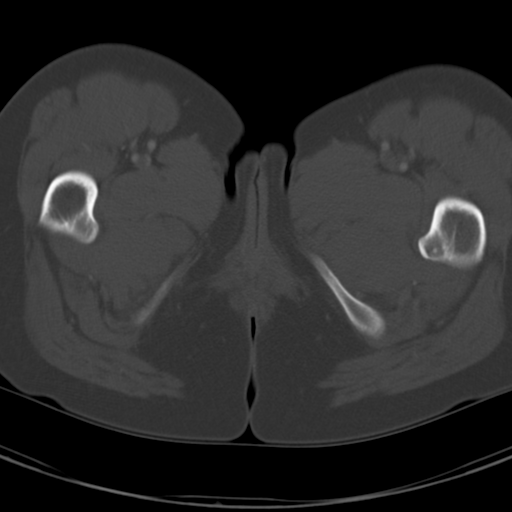
[im 12/89  soft-tissue]
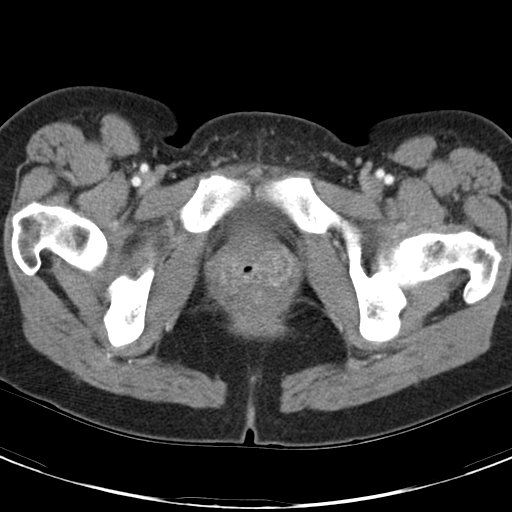
[im 19/89  soft-tissue]
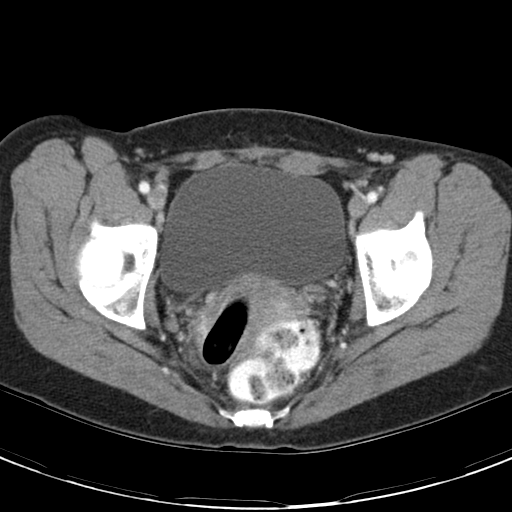
[im 26/89  soft-tissue]
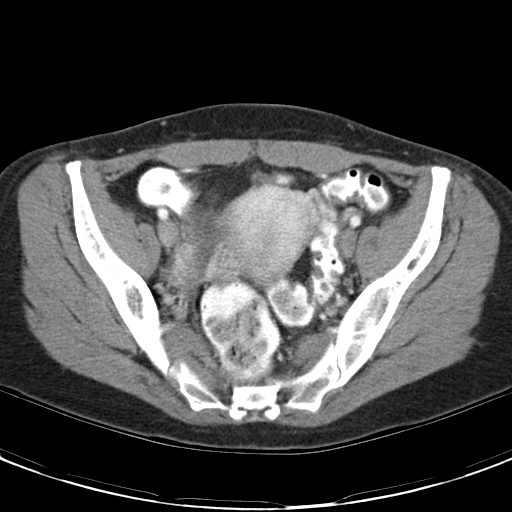
[im 30/89  soft-tissue]
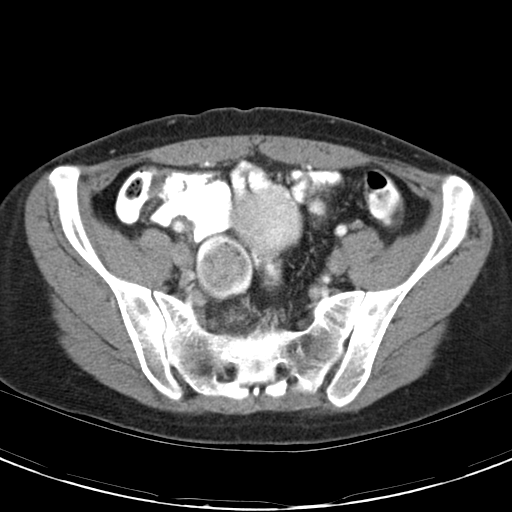
[im 37/89  soft-tissue]
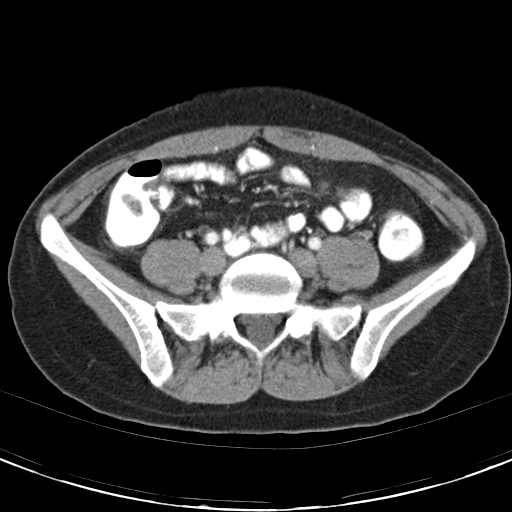
[im 45/89  soft-tissue]
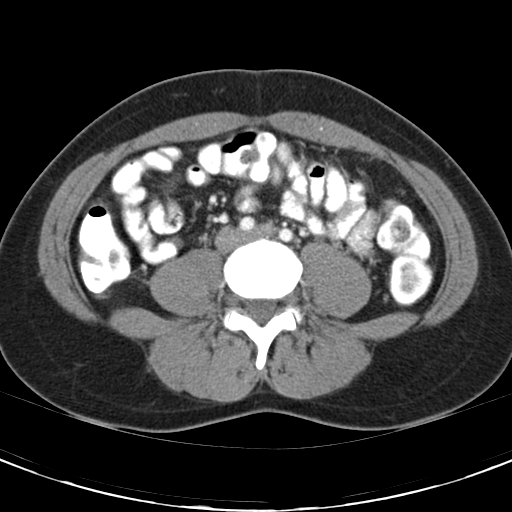
[im 52/89  soft-tissue]
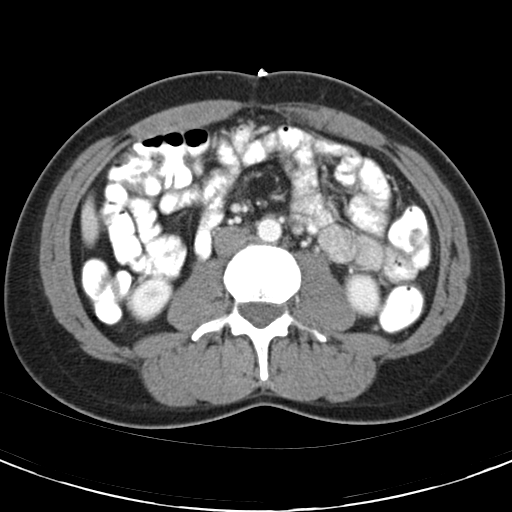
[im 59/89  soft-tissue]
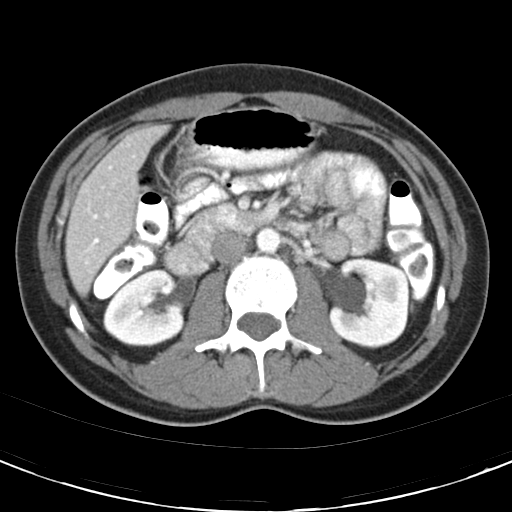
[im 59/89  bone]
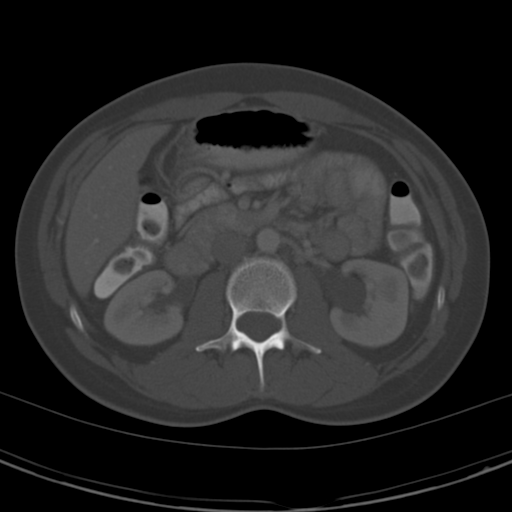
[im 63/89  soft-tissue]
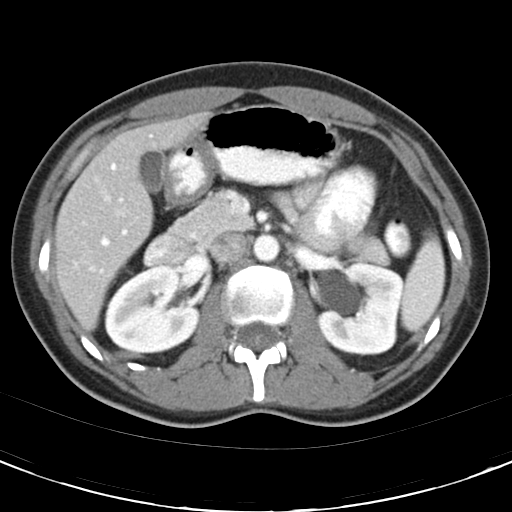
[im 70/89  soft-tissue]
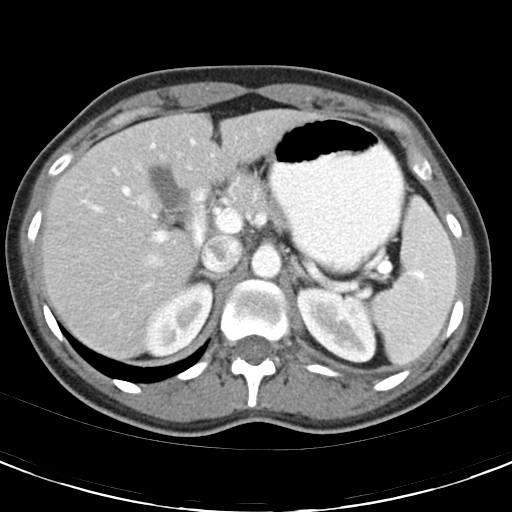
[im 78/89  soft-tissue]
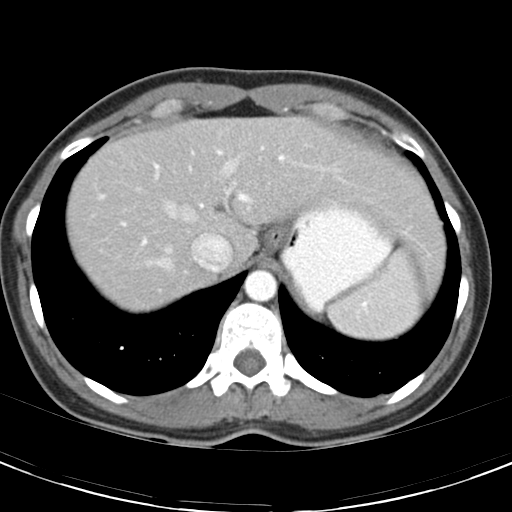
[im 85/89  soft-tissue]
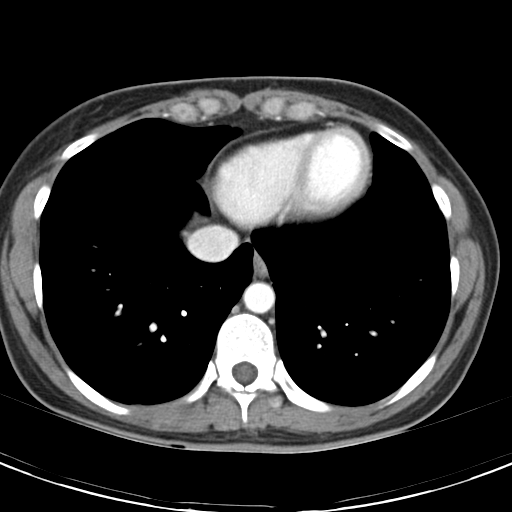

[Series 4: abd_pel_with 3.0 spo cor · coronal · 0.56mm/px · 3 of 71 slices shown]
[im 24/71  soft-tissue]
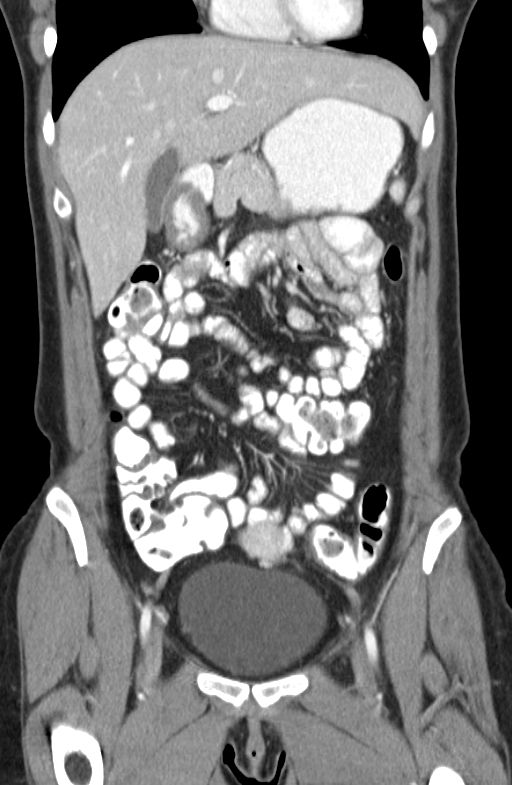
[im 32/71  soft-tissue]
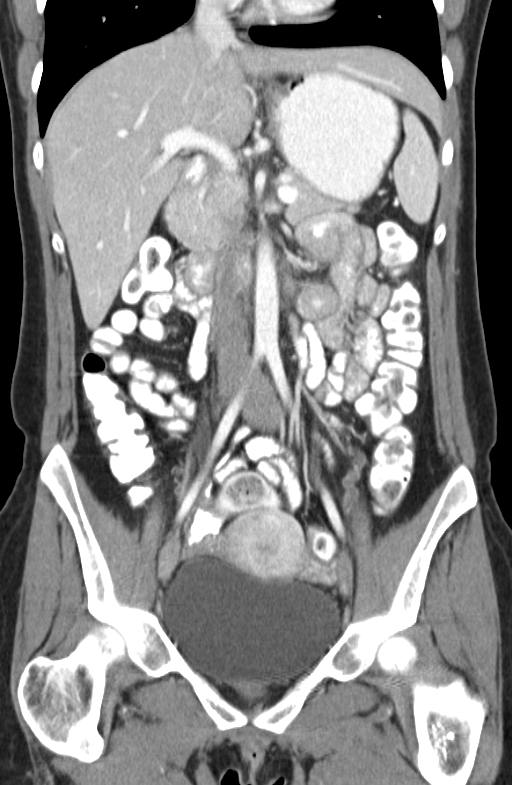
[im 39/71  soft-tissue]
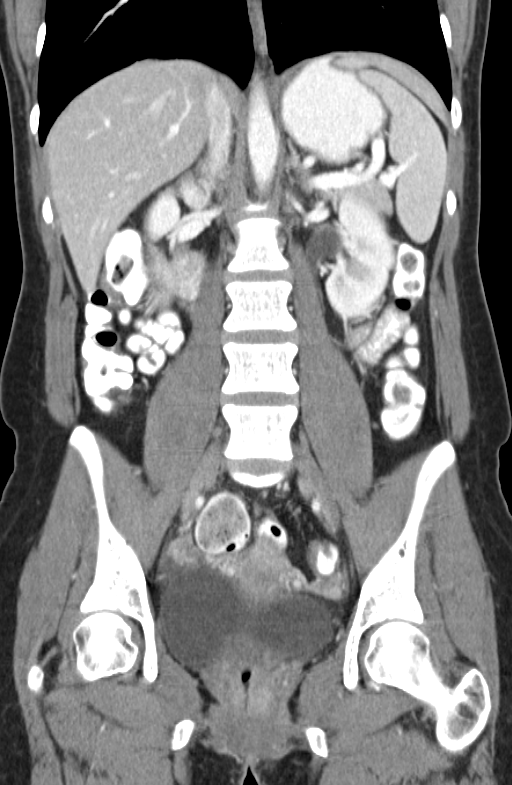

[16 of 46 positions shown; findings below may reference images not displayed]

FINDINGS: Good opacification of bowel with oral contrast material
is seen, and there is no evidence of bowel wall thickening or
dilatation.  No other inflammatory process or abnormal fluid
collections are seen within the abdomen or pelvis. Normal contrast
filled appendix is visualized.

The liver, gallbladder, pancreas, spleen, adrenal glands, and
kidneys are normal in appearance.  Uterus and adnexa are
unremarkable.  No soft tissue masses or lymphadenopathy identified
within the abdomen or pelvis. No evidence of hernia.
IMPRESSION: Negative.  No acute findings or other significant abnormality
identified.

## 2014-05-13 ENCOUNTER — Encounter: Payer: Self-pay | Admitting: Family Medicine

## 2014-05-13 ENCOUNTER — Ambulatory Visit (INDEPENDENT_AMBULATORY_CARE_PROVIDER_SITE_OTHER): Payer: BLUE CROSS/BLUE SHIELD | Admitting: Family Medicine

## 2014-05-13 VITALS — BP 110/62 | Temp 98.7°F | Ht 61.0 in | Wt 106.0 lb

## 2014-05-13 DIAGNOSIS — B9689 Other specified bacterial agents as the cause of diseases classified elsewhere: Secondary | ICD-10-CM

## 2014-05-13 DIAGNOSIS — J019 Acute sinusitis, unspecified: Secondary | ICD-10-CM

## 2014-05-13 MED ORDER — CEFPROZIL 250 MG PO TABS: 250.0000 mg | ORAL_TABLET | Freq: Two times a day (BID) | ORAL | Status: DC

## 2014-05-13 MED ORDER — ALPRAZOLAM 0.5 MG PO TABS: ORAL_TABLET | ORAL | Status: DC

## 2014-05-13 MED ORDER — VALACYCLOVIR HCL 500 MG PO TABS: ORAL_TABLET | ORAL | Status: DC

## 2014-05-13 NOTE — Progress Notes (Signed)
   Subjective:    Patient ID: Tina Ramirez, female    DOB: 06-25-1975, 39 y.o.   MRN: [id number]  Cough This is a new problem. The current episode started in the past 7 days. Associated symptoms include ear pain, a fever, headaches, nasal congestion, rhinorrhea and a sore throat. Pertinent negatives include no chest pain, shortness of breath or wheezing. Treatments tried: tylenol, decongestant.  no wheeze Cough worse at night Feels pressure in sinuses  Reqesting refill on valtrex for fever blister.    Review of Systems  Constitutional: Positive for fever. Negative for activity change.  HENT: Positive for congestion, ear pain, rhinorrhea and sore throat.   Eyes: Negative for discharge.  Respiratory: Positive for cough. Negative for shortness of breath and wheezing.   Cardiovascular: Negative for chest pain.  Neurological: Positive for headaches.       Objective:   Physical Exam  Constitutional: She appears well-developed.  HENT:  Head: Normocephalic.  Nose: Nose normal.  Mouth/Throat: Oropharynx is clear and moist. No oropharyngeal exudate.  Neck: Neck supple.  Cardiovascular: Normal rate and normal heart sounds.   No murmur heard. Pulmonary/Chest: Effort normal and breath sounds normal. She has no wheezes.  Lymphadenopathy:    She has no cervical adenopathy.  Skin: Skin is warm and dry.  Nursing note and vitals reviewed.         Assessment & Plan:  Viral syndrome Acute rhinosinusitis Antibiotics prescribed Warning signs discussed.

## 2014-06-24 ENCOUNTER — Encounter: Payer: Self-pay | Admitting: Family Medicine

## 2014-06-24 ENCOUNTER — Ambulatory Visit (INDEPENDENT_AMBULATORY_CARE_PROVIDER_SITE_OTHER): Payer: BLUE CROSS/BLUE SHIELD | Admitting: Family Medicine

## 2014-06-24 VITALS — Temp 98.4°F | Ht 61.0 in | Wt 104.0 lb

## 2014-06-24 DIAGNOSIS — J019 Acute sinusitis, unspecified: Secondary | ICD-10-CM

## 2014-06-24 DIAGNOSIS — B9689 Other specified bacterial agents as the cause of diseases classified elsewhere: Secondary | ICD-10-CM

## 2014-06-24 MED ORDER — CEFDINIR 300 MG PO CAPS: 300.0000 mg | ORAL_CAPSULE | Freq: Two times a day (BID) | ORAL | Status: DC

## 2014-06-24 MED ORDER — HYDROCODONE-HOMATROPINE 5-1.5 MG/5ML PO SYRP: ORAL_SOLUTION | ORAL | Status: DC

## 2014-06-24 NOTE — Progress Notes (Signed)
   Subjective:    Patient ID: Tina Ramirez, female    DOB: [birth year], 39 y.o.   MRN: [id number]  Cough This is a new problem. Episode onset: Wednesday. The problem has been gradually worsening. The cough is productive of sputum. Associated symptoms include ear pain, nasal congestion, postnasal drip, rhinorrhea and a sore throat. Pertinent negatives include no chest pain, fever, shortness of breath or wheezing. Nothing aggravates the symptoms. She has tried OTC cough suppressant for the symptoms. The treatment provided mild relief.    PMH benign   Review of Systems  Constitutional: Negative for fever and activity change.  HENT: Positive for congestion, ear pain, postnasal drip, rhinorrhea and sore throat.   Eyes: Negative for discharge.  Respiratory: Positive for cough. Negative for shortness of breath and wheezing.   Cardiovascular: Negative for chest pain.       Objective:   Physical Exam  Constitutional: She appears well-developed.  HENT:  Head: Normocephalic.  Nose: Nose normal.  Mouth/Throat: Oropharynx is clear and moist. No oropharyngeal exudate.  Neck: Neck supple.  Cardiovascular: Normal rate and normal heart sounds.   No murmur heard. Pulmonary/Chest: Effort normal and breath sounds normal. She has no wheezes.  Lymphadenopathy:    She has no cervical adenopathy.  Skin: Skin is warm and dry.  Nursing note and vitals reviewed.         Assessment & Plan:   viral syndrome Secondary sinusitis Antibiotics prescribed  warning signs discussed Follow-up if ongoing trouble.

## 2014-07-02 ENCOUNTER — Telehealth: Payer: Self-pay | Admitting: Family Medicine

## 2014-07-02 MED ORDER — VALACYCLOVIR HCL 500 MG PO TABS: ORAL_TABLET | ORAL | Status: DC

## 2014-07-02 MED ORDER — FLUCONAZOLE 150 MG PO TABS: 150.0000 mg | ORAL_TABLET | Freq: Once | ORAL | Status: DC

## 2014-07-02 NOTE — Telephone Encounter (Signed)
Meds sent. Pt notified. 

## 2014-07-02 NOTE — Telephone Encounter (Signed)
Pt is requesting a refill on her fever blister medicine and is also requesting  A prescription for diflucan.   [facility]

## 2014-07-02 NOTE — Telephone Encounter (Signed)
May refill Valtrex 6, Diflucan 150 mg 1 by mouth 1. 4 refills.

## 2014-08-05 ENCOUNTER — Other Ambulatory Visit: Payer: Self-pay | Admitting: Obstetrics & Gynecology

## 2014-08-06 LAB — CYTOLOGY - PAP

## 2014-12-30 ENCOUNTER — Encounter (INDEPENDENT_AMBULATORY_CARE_PROVIDER_SITE_OTHER): Payer: Self-pay | Admitting: Internal Medicine

## 2014-12-30 ENCOUNTER — Ambulatory Visit (INDEPENDENT_AMBULATORY_CARE_PROVIDER_SITE_OTHER): Payer: BLUE CROSS/BLUE SHIELD | Admitting: Internal Medicine

## 2014-12-30 VITALS — BP 94/62 | HR 80 | Temp 98.8°F | Ht 61.0 in | Wt 105.0 lb

## 2014-12-30 DIAGNOSIS — K219 Gastro-esophageal reflux disease without esophagitis: Secondary | ICD-10-CM

## 2014-12-30 DIAGNOSIS — R197 Diarrhea, unspecified: Secondary | ICD-10-CM | POA: Diagnosis not present

## 2014-12-30 MED ORDER — OMEPRAZOLE 40 MG PO CPDR: 40.0000 mg | DELAYED_RELEASE_CAPSULE | Freq: Every day | ORAL | Status: DC

## 2014-12-30 MED ORDER — DICYCLOMINE HCL 10 MG PO CAPS: 10.0000 mg | ORAL_CAPSULE | Freq: Three times a day (TID) | ORAL | Status: DC

## 2014-12-30 NOTE — Patient Instructions (Addendum)
Gi pathogen, CBC, Hepatic function. Rx for Omeprazole  daily 30 minutes before breakfast. Dicyclomine  tid.  OV 3 months

## 2014-12-30 NOTE — Progress Notes (Addendum)
   Subjective:    Patient ID: Tina Ramirez, female    DOB: March 07, 1976, 39 y.o.   MRN: [id number]  HPI Presents today with c/o epigastric pain and diarrhea. She has had these symptoms since middle school.  She has epigastric pain after eating. After she eats she will have epigastric pain.  She has diarrhea daily. She has tried antidiarrhea which has not helped. She has had diarrhea for a long time. She does have urgency. She usually has multiple times during the day.  There has been no melena or BRRB. Has not been on any recent antibiotics. She is not on a celiac diet now. She denies having a fever. Hx of C-diff in 2013.   10/06/2011 EGD  Indications: Patient is 39 year old Caucasian female with a recurrent postprandial nausea and epigastric pain intermittent diarrhea. She's been diagnosed with celiac disease but did not respond to gluten-free diet despite being very compliant. She has rechallenged with gluten and undergoing diagnostic EGD.  Impression: Normal esophagogastroduodenoscopy. Duodenal biopsy taken to rule out celiac disease.  Biopsy results reviewed with the patient. No evidence of celiac disease. Review of Systems Past Medical History  Diagnosis Date  . Celiac disease     pt says was a misdiagnosis    Past Surgical History  Procedure Laterality Date  . Cesarean section      x 2  . Hand surgery      right x 2   . Esophagogastroduodenoscopy  10/06/2011    Procedure: ESOPHAGOGASTRODUODENOSCOPY (EGD);  Surgeon: Malissa Hippo, MD;  Location: [facility];  Service: Endoscopy;  Laterality: N/A;  310  . Mouth surgery    . Adenoidectomy      No Known Allergies  No current outpatient prescriptions on file prior to visit.   No current facility-administered medications on file prior to visit.        Objective:   Physical ExamBlood pressure 94/62, pulse 80,  temperature 98.8 F (37.1 C), height  (1.549 m), weight 105 lb (47.628 kg).  Alert and oriented. Skin warm and dry. Oral mucosa is moist.   . Sclera anicteric, conjunctivae is pink. Thyroid not enlarged. No cervical lymphadenopathy. Lungs clear. Heart regular rate and rhythm.  Abdomen is soft. Bowel sounds are positive. No hepatomegaly. No abdominal masses felt. No tenderness.  No edema to lower extremities.         Assessment & Plan:  Chronic diarrhea. Will get a GI pathogen. CBC, Hepatic function. Dicyclomine  tid. GERD: Will try her on Omeprazole  daily. OV in 3 months. Will see how she is doing. May eventually need a colonoscopy.

## 2014-12-31 LAB — CBC WITH DIFFERENTIAL/PLATELET
BASOS PCT: 0 % (ref 0–1)
Basophils Absolute: 0 10*3/uL (ref 0.0–0.1)
EOS PCT: 4 % (ref 0–5)
Eosinophils Absolute: 0.2 10*3/uL (ref 0.0–0.7)
HEMATOCRIT: 37.8 % (ref 36.0–46.0)
HEMOGLOBIN: 12.6 g/dL (ref 12.0–15.0)
Lymphocytes Relative: 46 % (ref 12–46)
Lymphs Abs: 2.7 10*3/uL (ref 0.7–4.0)
MCH: 31.8 pg (ref 26.0–34.0)
MCHC: 33.3 g/dL (ref 30.0–36.0)
MCV: 95.5 fL (ref 78.0–100.0)
MONO ABS: 0.5 10*3/uL (ref 0.1–1.0)
MONOS PCT: 8 % (ref 3–12)
MPV: 9.5 fL (ref 8.6–12.4)
NEUTROS ABS: 2.5 10*3/uL (ref 1.7–7.7)
Neutrophils Relative %: 42 % — ABNORMAL LOW (ref 43–77)
Platelets: 232 10*3/uL (ref 150–400)
RBC: 3.96 MIL/uL (ref 3.87–5.11)
RDW: 13.2 % (ref 11.5–15.5)
WBC: 5.9 10*3/uL (ref 4.0–10.5)

## 2014-12-31 LAB — HEPATIC FUNCTION PANEL
ALT: 8 U/L (ref 6–29)
AST: 15 U/L (ref 10–30)
Albumin: 4.3 g/dL (ref 3.6–5.1)
Alkaline Phosphatase: 47 U/L (ref 33–115)
BILIRUBIN DIRECT: 0.1 mg/dL (ref <=0.2)
Indirect Bilirubin: 0.2 mg/dL (ref 0.2–1.2)
Total Bilirubin: 0.3 mg/dL (ref 0.2–1.2)
Total Protein: 6.8 g/dL (ref 6.1–8.1)

## 2014-12-31 LAB — C-REACTIVE PROTEIN

## 2015-01-06 LAB — GASTROINTESTINAL PATHOGEN PANEL PCR

## 2015-03-31 ENCOUNTER — Ambulatory Visit (INDEPENDENT_AMBULATORY_CARE_PROVIDER_SITE_OTHER): Payer: BLUE CROSS/BLUE SHIELD | Admitting: Internal Medicine

## 2015-03-31 ENCOUNTER — Encounter (INDEPENDENT_AMBULATORY_CARE_PROVIDER_SITE_OTHER): Payer: Self-pay | Admitting: *Deleted

## 2015-06-21 ENCOUNTER — Inpatient Hospital Stay (HOSPITAL_COMMUNITY): Payer: BLUE CROSS/BLUE SHIELD

## 2015-06-21 ENCOUNTER — Encounter (HOSPITAL_COMMUNITY): Payer: Self-pay | Admitting: *Deleted

## 2015-06-21 ENCOUNTER — Inpatient Hospital Stay (HOSPITAL_COMMUNITY)
Admission: AD | Admit: 2015-06-21 | Discharge: 2015-06-21 | Disposition: A | Discharge status: Home / Self Care | Payer: BLUE CROSS/BLUE SHIELD | Source: Ambulatory Visit | Attending: Obstetrics & Gynecology | Admitting: Obstetrics & Gynecology

## 2015-06-21 DIAGNOSIS — O4691 Antepartum hemorrhage, unspecified, first trimester: Secondary | ICD-10-CM | POA: Diagnosis not present

## 2015-06-21 DIAGNOSIS — Z3A01 Less than 8 weeks gestation of pregnancy: Secondary | ICD-10-CM | POA: Diagnosis not present

## 2015-06-21 DIAGNOSIS — R109 Unspecified abdominal pain: Secondary | ICD-10-CM | POA: Diagnosis not present

## 2015-06-21 DIAGNOSIS — O209 Hemorrhage in early pregnancy, unspecified: Secondary | ICD-10-CM

## 2015-06-21 LAB — CBC WITH DIFFERENTIAL/PLATELET
BASOS ABS: 0 10*3/uL (ref 0.0–0.1)
Basophils Relative: 0 %
Eosinophils Absolute: 0.1 10*3/uL (ref 0.0–0.7)
Eosinophils Relative: 1 %
HEMATOCRIT: 34.8 % — AB (ref 36.0–46.0)
HEMOGLOBIN: 12.3 g/dL (ref 12.0–15.0)
LYMPHS PCT: 40 %
Lymphs Abs: 2.6 10*3/uL (ref 0.7–4.0)
MCH: 32.4 pg (ref 26.0–34.0)
MCHC: 35.3 g/dL (ref 30.0–36.0)
MCV: 91.6 fL (ref 78.0–100.0)
Monocytes Absolute: 0.4 10*3/uL (ref 0.1–1.0)
Monocytes Relative: 6 %
NEUTROS ABS: 3.5 10*3/uL (ref 1.7–7.7)
NEUTROS PCT: 53 %
Platelets: 202 10*3/uL (ref 150–400)
RBC: 3.8 MIL/uL — ABNORMAL LOW (ref 3.87–5.11)
RDW: 12 % (ref 11.5–15.5)
WBC: 6.5 10*3/uL (ref 4.0–10.5)

## 2015-06-21 LAB — URINALYSIS, ROUTINE W REFLEX MICROSCOPIC
BILIRUBIN URINE: NEGATIVE
Glucose, UA: NEGATIVE mg/dL
Hgb urine dipstick: NEGATIVE
Ketones, ur: NEGATIVE mg/dL
Leukocytes, UA: NEGATIVE
Nitrite: NEGATIVE
PH: 7 (ref 5.0–8.0)
Protein, ur: NEGATIVE mg/dL
SPECIFIC GRAVITY, URINE: 1.015 (ref 1.005–1.030)

## 2015-06-21 LAB — WET PREP, GENITAL
Clue Cells Wet Prep HPF POC: NONE SEEN
SPERM: NONE SEEN
Trich, Wet Prep: NONE SEEN
YEAST WET PREP: NONE SEEN

## 2015-06-21 LAB — ABO/RH: ABO/RH(D): A POS

## 2015-06-21 LAB — TYPE AND SCREEN
ABO/RH(D): A POS
ANTIBODY SCREEN: NEGATIVE

## 2015-06-21 LAB — POCT PREGNANCY, URINE: PREG TEST UR: POSITIVE — AB

## 2015-06-21 LAB — HCG, QUANTITATIVE, PREGNANCY: HCG, BETA CHAIN, QUANT, S: 135731 m[IU]/mL — AB (ref <5)

## 2015-06-21 NOTE — MAU Note (Signed)
Patient reports onset of cramping this morning which got worse as the day continued, having spotting. LMP 05/05/15

## 2015-06-21 NOTE — Discharge Instructions (Signed)

## 2015-06-21 NOTE — MAU Provider Note (Signed)
MAU HISTORY AND PHYSICAL  Chief Complaint:  Abdominal Cramping and Vaginal Bleeding   Tina Ramirez is a 40 y.o.  [phone] with IUP at Unknown presenting for Abdominal Cramping and Vaginal Bleeding  UPT positive at home. Hasn't established care. Has upcoming appt w/ Dr. Rana Snare. No u/s or labwork thus far this pregnancy. Began this morning. Noticed small amount of spotting when went to bathroom. Having period-like cramping. Some more spotting this afternoon.     Past Medical History  Diagnosis Date  . Celiac disease     pt says was a misdiagnosis    Past Surgical History  Procedure Laterality Date  . Cesarean section      x 2  . Hand surgery      right x 2   . Esophagogastroduodenoscopy  10/06/2011    Procedure: ESOPHAGOGASTRODUODENOSCOPY (EGD);  Surgeon: Malissa Hippo, MD;  Location: [facility];  Service: Endoscopy;  Laterality: N/A;  310  . Mouth surgery    . Adenoidectomy      Family History  Problem Relation Age of Onset  . Asthma Son     Social History  Substance Use Topics  . Smoking status: Never Smoker   . Smokeless tobacco: Never Used  . Alcohol Use: Yes     Comment: occasional    No Known Allergies  Prescriptions prior to admission  Medication Sig Dispense Refill Last Dose  . dicyclomine (BENTYL) 10 MG capsule Take 1 capsule (10 mg total) by mouth 3 (three) times daily before meals. (Patient not taking: Reported on 06/21/2015) 90 capsule 0 Not Taking at Unknown time  . omeprazole (PRILOSEC) 40 MG capsule Take 1 capsule (40 mg total) by mouth daily. (Patient not taking: Reported on 06/21/2015) 90 capsule 3 Not Taking at Unknown time    Review of Systems - Negative except for what is mentioned in HPI.  Physical Exam  Blood pressure 132/81, pulse 94, temperature 98.9 F (37.2 C), temperature source Oral, resp. rate 18, height  (1.549 m), weight 104 lb (47.174 kg), last menstrual period 05/05/2015. GENERAL: Well-developed, well-nourished female in no  acute distress.  LUNGS: Clear to auscultation bilaterally.  HEART: Regular rate and rhythm. ABDOMEN: Soft, nontender, nondistended, gravid.  EXTREMITIES: Nontender, no edema, 2+ distal pulses. Sse: small amount dark brown discharge, cervix visually closed   Labs: Results for orders placed or performed during the hospital encounter of 06/21/15 (from the past 24 hour(s))  Urinalysis, Routine w reflex microscopic (not at [facility])   Collection Time: 06/21/15  5:45 PM  Result Value Ref Range   Color, Urine YELLOW YELLOW   APPearance CLEAR CLEAR   Specific Gravity, Urine 1.015 1.005 - 1.030   pH 7.0 5.0 - 8.0   Glucose, UA NEGATIVE NEGATIVE mg/dL   Hgb urine dipstick NEGATIVE NEGATIVE   Bilirubin Urine NEGATIVE NEGATIVE   Ketones, ur NEGATIVE NEGATIVE mg/dL   Protein, ur NEGATIVE NEGATIVE mg/dL   Nitrite NEGATIVE NEGATIVE   Leukocytes, UA NEGATIVE NEGATIVE  Pregnancy, urine POC   Collection Time: 06/21/15  5:53 PM  Result Value Ref Range   Preg Test, Ur POSITIVE (A) NEGATIVE  Wet prep, genital   Collection Time: 06/21/15  6:14 PM  Result Value Ref Range   Yeast Wet Prep HPF POC NONE SEEN NONE SEEN   Trich, Wet Prep NONE SEEN NONE SEEN   Clue Cells Wet Prep HPF POC NONE SEEN NONE SEEN   WBC, Wet Prep HPF POC FEW (A) NONE SEEN  Sperm NONE SEEN   hCG, quantitative, pregnancy   Collection Time: 06/21/15  6:28 PM  Result Value Ref Range   hCG, Beta Chain, Quant, S 135731 (H) <5 mIU/mL  CBC with Differential/Platelet   Collection Time: 06/21/15  6:28 PM  Result Value Ref Range   WBC 6.5 4.0 - 10.5 K/uL   RBC 3.80 (L) 3.87 - 5.11 MIL/uL   Hemoglobin 12.3 12.0 - 15.0 g/dL   HCT 96.0 (L) 45.4 - 09.8 %   MCV 91.6 78.0 - 100.0 fL   MCH 32.4 26.0 - 34.0 pg   MCHC 35.3 30.0 - 36.0 g/dL   RDW 11.9 14.7 - 82.9 %   Platelets 202 150 - 400 K/uL   Neutrophils Relative % 53 %   Neutro Abs 3.5 1.7 - 7.7 K/uL   Lymphocytes Relative 40 %   Lymphs Abs 2.6 0.7 - 4.0 K/uL   Monocytes Relative 6  %   Monocytes Absolute 0.4 0.1 - 1.0 K/uL   Eosinophils Relative 1 %   Eosinophils Absolute 0.1 0.0 - 0.7 K/uL   Basophils Relative 0 %   Basophils Absolute 0.0 0.0 - 0.1 K/uL  Type and screen   Collection Time: 06/21/15  6:28 PM  Result Value Ref Range   ABO/RH(D) A POS    Antibody Screen NEG    Sample Expiration 06/24/2015     Imaging Studies:  US Ob Comp Less 14 Wks  06/21/2015  CLINICAL DATA:  40 year old pregnant female with abdominal cramping and vaginal bleeding. EXAM: OBSTETRIC <14 WK [place] AND TRANSVAGINAL OB US TECHNIQUE: Both transabdominal and transvaginal ultrasound examinations were performed for complete evaluation of the gestation as well as the maternal uterus, adnexal regions, and pelvic cul-de-sac. Transvaginal technique was performed to assess early pregnancy. COMPARISON:  None. FINDINGS: Intrauterine gestational sac: Single intrauterine gestational sac. Yolk sac:  Seen Embryo:  Present Cardiac Activity: Detected Heart Rate: 136  bpm CRL:  10  mm   7 w   1 d                  [place] EDC: 02/06/2016 Subchorionic hemorrhage:  None visualized. Maternal uterus/adnexae: The maternal ovaries appear unremarkable. The right ovary measures 3.2 x 1.3 x 82.0 cm and the left ovary measures 3.1 x 2.1 x 2.3 cm. A corpus luteum is noted in the left ovary. Chest No free fluid noted within the pelvis. IMPRESSION: Single live intrauterine pregnancy with an estimated gestational age of [redacted] weeks 1 day. Electronically Signed   By: Elgie Collard M.D.   On: 06/21/2015 19:01   US Ob Transvaginal  06/21/2015  CLINICAL DATA:  40 year old pregnant female with abdominal cramping and vaginal bleeding. EXAM: OBSTETRIC <14 WK [place] AND TRANSVAGINAL OB US TECHNIQUE: Both transabdominal and transvaginal ultrasound examinations were performed for complete evaluation of the gestation as well as the maternal uterus, adnexal regions, and pelvic cul-de-sac. Transvaginal technique was performed to assess early pregnancy.  COMPARISON:  None. FINDINGS: Intrauterine gestational sac: Single intrauterine gestational sac. Yolk sac:  Seen Embryo:  Present Cardiac Activity: Detected Heart Rate: 136  bpm CRL:  10  mm   7 w   1 d                  [place] EDC: 02/06/2016 Subchorionic hemorrhage:  None visualized. Maternal uterus/adnexae: The maternal ovaries appear unremarkable. The right ovary measures 3.2 x 1.3 x 82.0 cm and the left ovary measures 3.1 x 2.1 x 2.3 cm. A  corpus luteum is noted in the left ovary. Chest No free fluid noted within the pelvis. IMPRESSION: Single live intrauterine pregnancy with an estimated gestational age of [redacted] weeks 1 day. Electronically Signed   By: Elgie CollardArash  Radparvar M.D.   On: 06/21/2015 19:01    Assessment/Plan: Tina Ramirez is  40 y.o. [phone] 6+5 by lmp presents with first trimester vaginal bleeding. Described as mild. Hemodynamically stable. Rh positive. Urinalysis and wet prep unremarkable. US showing viable iup with no signs bleeding. Called and discussed w/ patient's on-call OB wnl is in agreement with this assessment and plan. - d/c home w/ clinic f/u next week - spontaneous abortion return precautions - f/u gonorrhea/chlamydia  Silvano Bilisoah B Rinnah Peppel 3/4/20178:07 PM

## 2015-06-23 LAB — GC/CHLAMYDIA PROBE AMP (~~LOC~~) NOT AT ARMC
Chlamydia: NEGATIVE
Neisseria Gonorrhea: NEGATIVE

## 2015-07-04 LAB — OB RESULTS CONSOLE GC/CHLAMYDIA
CHLAMYDIA, DNA PROBE: NEGATIVE
GC PROBE AMP, GENITAL: NEGATIVE

## 2015-07-04 LAB — OB RESULTS CONSOLE ABO/RH: RH Type: POSITIVE

## 2015-07-04 LAB — OB RESULTS CONSOLE ANTIBODY SCREEN: ANTIBODY SCREEN: NEGATIVE

## 2015-07-04 LAB — OB RESULTS CONSOLE RUBELLA ANTIBODY, IGM: RUBELLA: IMMUNE

## 2015-07-04 LAB — OB RESULTS CONSOLE HIV ANTIBODY (ROUTINE TESTING): HIV: NONREACTIVE

## 2015-07-04 LAB — OB RESULTS CONSOLE RPR: RPR: NONREACTIVE

## 2015-07-04 LAB — OB RESULTS CONSOLE HEPATITIS B SURFACE ANTIGEN: Hepatitis B Surface Ag: NEGATIVE

## 2015-07-29 DIAGNOSIS — Z3A12 12 weeks gestation of pregnancy: Secondary | ICD-10-CM | POA: Diagnosis not present

## 2015-07-29 DIAGNOSIS — O09521 Supervision of elderly multigravida, first trimester: Secondary | ICD-10-CM | POA: Diagnosis not present

## 2015-09-05 DIAGNOSIS — Z36 Encounter for antenatal screening of mother: Secondary | ICD-10-CM | POA: Diagnosis not present

## 2015-09-05 DIAGNOSIS — O09213 Supervision of pregnancy with history of pre-term labor, third trimester: Secondary | ICD-10-CM | POA: Diagnosis not present

## 2015-09-08 DIAGNOSIS — Z36 Encounter for antenatal screening of mother: Secondary | ICD-10-CM | POA: Diagnosis not present

## 2015-09-10 DIAGNOSIS — Z3A18 18 weeks gestation of pregnancy: Secondary | ICD-10-CM | POA: Diagnosis not present

## 2015-09-17 DIAGNOSIS — O09212 Supervision of pregnancy with history of pre-term labor, second trimester: Secondary | ICD-10-CM | POA: Diagnosis not present

## 2015-09-17 DIAGNOSIS — Z3A19 19 weeks gestation of pregnancy: Secondary | ICD-10-CM | POA: Diagnosis not present

## 2015-09-25 DIAGNOSIS — Z3A20 20 weeks gestation of pregnancy: Secondary | ICD-10-CM | POA: Diagnosis not present

## 2015-10-03 DIAGNOSIS — O09213 Supervision of pregnancy with history of pre-term labor, third trimester: Secondary | ICD-10-CM | POA: Diagnosis not present

## 2015-10-08 DIAGNOSIS — Z3A22 22 weeks gestation of pregnancy: Secondary | ICD-10-CM | POA: Diagnosis not present

## 2015-10-15 DIAGNOSIS — Z3A23 23 weeks gestation of pregnancy: Secondary | ICD-10-CM | POA: Diagnosis not present

## 2015-10-22 DIAGNOSIS — Z3A24 24 weeks gestation of pregnancy: Secondary | ICD-10-CM | POA: Diagnosis not present

## 2015-10-29 DIAGNOSIS — Z3A25 25 weeks gestation of pregnancy: Secondary | ICD-10-CM | POA: Diagnosis not present

## 2015-10-29 DIAGNOSIS — O09212 Supervision of pregnancy with history of pre-term labor, second trimester: Secondary | ICD-10-CM | POA: Diagnosis not present

## 2015-10-31 DIAGNOSIS — O09213 Supervision of pregnancy with history of pre-term labor, third trimester: Secondary | ICD-10-CM | POA: Diagnosis not present

## 2015-11-04 DIAGNOSIS — Z23 Encounter for immunization: Secondary | ICD-10-CM | POA: Diagnosis not present

## 2015-11-04 DIAGNOSIS — Z3A26 26 weeks gestation of pregnancy: Secondary | ICD-10-CM | POA: Diagnosis not present

## 2015-11-04 DIAGNOSIS — D509 Iron deficiency anemia, unspecified: Secondary | ICD-10-CM | POA: Diagnosis not present

## 2015-11-04 DIAGNOSIS — Z36 Encounter for antenatal screening of mother: Secondary | ICD-10-CM | POA: Diagnosis not present

## 2015-11-12 DIAGNOSIS — Z3A27 27 weeks gestation of pregnancy: Secondary | ICD-10-CM | POA: Diagnosis not present

## 2015-11-18 DIAGNOSIS — O09293 Supervision of pregnancy with other poor reproductive or obstetric history, third trimester: Secondary | ICD-10-CM | POA: Diagnosis not present

## 2015-11-18 DIAGNOSIS — Z3A28 28 weeks gestation of pregnancy: Secondary | ICD-10-CM | POA: Diagnosis not present

## 2015-11-26 DIAGNOSIS — Z3A29 29 weeks gestation of pregnancy: Secondary | ICD-10-CM | POA: Diagnosis not present

## 2015-12-03 DIAGNOSIS — D649 Anemia, unspecified: Secondary | ICD-10-CM | POA: Diagnosis not present

## 2015-12-03 DIAGNOSIS — O09213 Supervision of pregnancy with history of pre-term labor, third trimester: Secondary | ICD-10-CM | POA: Diagnosis not present

## 2015-12-03 DIAGNOSIS — Z3A30 30 weeks gestation of pregnancy: Secondary | ICD-10-CM | POA: Diagnosis not present

## 2015-12-04 DIAGNOSIS — Z3A30 30 weeks gestation of pregnancy: Secondary | ICD-10-CM | POA: Diagnosis not present

## 2015-12-11 DIAGNOSIS — Z3A31 31 weeks gestation of pregnancy: Secondary | ICD-10-CM | POA: Diagnosis not present

## 2015-12-18 DIAGNOSIS — O09293 Supervision of pregnancy with other poor reproductive or obstetric history, third trimester: Secondary | ICD-10-CM | POA: Diagnosis not present

## 2015-12-18 DIAGNOSIS — Z36 Encounter for antenatal screening of mother: Secondary | ICD-10-CM | POA: Diagnosis not present

## 2015-12-18 DIAGNOSIS — Z3A32 32 weeks gestation of pregnancy: Secondary | ICD-10-CM | POA: Diagnosis not present

## 2015-12-24 DIAGNOSIS — O9989 Other specified diseases and conditions complicating pregnancy, childbirth and the puerperium: Secondary | ICD-10-CM | POA: Diagnosis not present

## 2015-12-24 DIAGNOSIS — Z3A33 33 weeks gestation of pregnancy: Secondary | ICD-10-CM | POA: Diagnosis not present

## 2015-12-30 DIAGNOSIS — Z3A34 34 weeks gestation of pregnancy: Secondary | ICD-10-CM | POA: Diagnosis not present

## 2015-12-30 DIAGNOSIS — O09213 Supervision of pregnancy with history of pre-term labor, third trimester: Secondary | ICD-10-CM | POA: Diagnosis not present

## 2015-12-30 DIAGNOSIS — O26893 Other specified pregnancy related conditions, third trimester: Secondary | ICD-10-CM | POA: Diagnosis not present

## 2015-12-31 DIAGNOSIS — O26893 Other specified pregnancy related conditions, third trimester: Secondary | ICD-10-CM | POA: Diagnosis not present

## 2015-12-31 DIAGNOSIS — Z3A34 34 weeks gestation of pregnancy: Secondary | ICD-10-CM | POA: Diagnosis not present

## 2015-12-31 DIAGNOSIS — O09213 Supervision of pregnancy with history of pre-term labor, third trimester: Secondary | ICD-10-CM | POA: Diagnosis not present

## 2016-01-07 DIAGNOSIS — Z36 Encounter for antenatal screening of mother: Secondary | ICD-10-CM | POA: Diagnosis not present

## 2016-01-07 DIAGNOSIS — Z3A35 35 weeks gestation of pregnancy: Secondary | ICD-10-CM | POA: Diagnosis not present

## 2016-01-07 DIAGNOSIS — D649 Anemia, unspecified: Secondary | ICD-10-CM | POA: Diagnosis not present

## 2016-01-07 DIAGNOSIS — O9989 Other specified diseases and conditions complicating pregnancy, childbirth and the puerperium: Secondary | ICD-10-CM | POA: Diagnosis not present

## 2016-01-14 DIAGNOSIS — O9989 Other specified diseases and conditions complicating pregnancy, childbirth and the puerperium: Secondary | ICD-10-CM | POA: Diagnosis not present

## 2016-01-14 DIAGNOSIS — Z3A36 36 weeks gestation of pregnancy: Secondary | ICD-10-CM | POA: Diagnosis not present

## 2016-01-14 DIAGNOSIS — D509 Iron deficiency anemia, unspecified: Secondary | ICD-10-CM | POA: Diagnosis not present

## 2016-01-17 LAB — OB RESULTS CONSOLE GBS: GBS: NEGATIVE

## 2016-01-21 DIAGNOSIS — Z23 Encounter for immunization: Secondary | ICD-10-CM | POA: Diagnosis not present

## 2016-01-27 ENCOUNTER — Encounter (HOSPITAL_COMMUNITY): Payer: Self-pay

## 2016-01-28 DIAGNOSIS — Z3A38 38 weeks gestation of pregnancy: Secondary | ICD-10-CM | POA: Diagnosis not present

## 2016-01-28 DIAGNOSIS — O9989 Other specified diseases and conditions complicating pregnancy, childbirth and the puerperium: Secondary | ICD-10-CM | POA: Diagnosis not present

## 2016-01-28 DIAGNOSIS — Z3480 Encounter for supervision of other normal pregnancy, unspecified trimester: Secondary | ICD-10-CM | POA: Diagnosis not present

## 2016-02-02 ENCOUNTER — Encounter (HOSPITAL_COMMUNITY): Payer: Self-pay

## 2016-02-02 ENCOUNTER — Encounter (HOSPITAL_COMMUNITY)
Admission: RE | Admit: 2016-02-02 | Discharge: 2016-02-02 | Disposition: A | Discharge status: Home / Self Care | Payer: BLUE CROSS/BLUE SHIELD | Source: Ambulatory Visit | Attending: Obstetrics and Gynecology | Admitting: Obstetrics and Gynecology

## 2016-02-02 DIAGNOSIS — O34211 Maternal care for low transverse scar from previous cesarean delivery: Secondary | ICD-10-CM | POA: Diagnosis not present

## 2016-02-02 DIAGNOSIS — Z3A39 39 weeks gestation of pregnancy: Secondary | ICD-10-CM | POA: Diagnosis not present

## 2016-02-02 HISTORY — DX: Unspecified abnormal cytological findings in specimens from vagina: R87.629

## 2016-02-02 HISTORY — DX: Supervision of elderly multigravida, unspecified trimester: O09.529

## 2016-02-02 HISTORY — DX: Irritable bowel syndrome, unspecified: K58.9

## 2016-02-02 HISTORY — DX: Depression, unspecified: F32.A

## 2016-02-02 HISTORY — DX: Anogenital herpesviral infection, unspecified: A60.9

## 2016-02-02 HISTORY — DX: Major depressive disorder, single episode, unspecified: F32.9

## 2016-02-02 LAB — CBC
HCT: 32 % — ABNORMAL LOW (ref 36.0–46.0)
HEMOGLOBIN: 10.7 g/dL — AB (ref 12.0–15.0)
MCH: 32 pg (ref 26.0–34.0)
MCHC: 33.4 g/dL (ref 30.0–36.0)
MCV: 95.8 fL (ref 78.0–100.0)
Platelets: 143 10*3/uL — ABNORMAL LOW (ref 150–400)
RBC: 3.34 MIL/uL — AB (ref 3.87–5.11)
RDW: 14.2 % (ref 11.5–15.5)
WBC: 6.4 10*3/uL (ref 4.0–10.5)

## 2016-02-02 LAB — TYPE AND SCREEN
ABO/RH(D): A POS
Antibody Screen: NEGATIVE

## 2016-02-02 MED ORDER — DEXTROSE 5 % IV SOLN
2.0000 g | INTRAVENOUS | Status: AC
  Administered 2016-02-03: 2 g via INTRAVENOUS
  Filled 2016-02-02: qty 2

## 2016-02-02 NOTE — H&P (Addendum)
Tina StakesMary S Ramirez is a 40 y.o. female presenting for repeat C/S.  Pregnancy complicated by AMA with normal NIPT.  Hx IUFD, and PTD.  GBS-. OB History    Gravida Para Term Preterm AB Living   4 3 0 3   1   SAB TAB Ectopic Multiple Live Births           1     Past Medical History:  Diagnosis Date  . AMA (advanced maternal age) multigravida 35+   . Celiac disease    pt says was a misdiagnosis  . Depression   . HSV (herpes simplex virus) anogenital infection   . IBS (irritable bowel syndrome)   . Vaginal Pap smear, abnormal    Past Surgical History:  Procedure Laterality Date  . ADENOIDECTOMY    . CESAREAN SECTION     x 2  . cold knife conization  2007  . ESOPHAGOGASTRODUODENOSCOPY  10/06/2011   Procedure: ESOPHAGOGASTRODUODENOSCOPY (EGD);  Surgeon: Malissa HippoNajeeb U Rehman, MD;  Location: [facility];  Service: Endoscopy;  Laterality: N/A;  310  . HAND SURGERY     right x 2   . MOUTH SURGERY     Family History: family history includes Asthma in her son; Transient ischemic attack in her mother. Social History:  reports that she has never smoked. She has never used smokeless tobacco. She reports that she drinks alcohol. She reports that she does not use drugs.     Maternal Diabetes: No Genetic Screening: Normal Maternal Ultrasounds/Referrals: Normal Fetal Ultrasounds or other Referrals:  None Maternal Substance Abuse:  No Significant Maternal Medications:  None Significant Maternal Lab Results:  None Other Comments:  None  ROS History   Last menstrual period 05/05/2015. Exam Physical Exam  Prenatal labs: ABO, Rh: --/--/A POS (10/16 1220) Antibody: NEG (10/16 1220) Rubella: Immune (03/17 0000) RPR: Nonreactive (03/17 0000)  HBsAg: Negative (03/17 0000)  HIV: Non-reactive (03/17 0000)  GBS: Negative (09/30 0000)   Assessment/Plan: IUP at term Prev c/s x 2 for repeat HX HSV no recent sxs Hx IUFD and PTD (x2) Risks and benefits of C/S were discussed.  All questions were  answered and informed consent was obtained.  Plan to proceed with low segment transverse Cesarean Section. This patient has been seen and examined.   All of her questions were answered.  Labs and vital signs reviewed.  Informed consent has been obtained.  The History and Physical is current.   Tina Ramirez C 02/02/2016, 9:54 PM

## 2016-02-02 NOTE — Patient Instructions (Signed)
20 Tina Ramirez  02/02/2016   Your procedure is scheduled on:  02/03/2016  [facility] through [facility] at 0600 AM.  Pick up the phone at the desk and dial [phone].   Call this number if you have problems the morning of surgery: [phone]   Remember:   Do not eat food:After Midnight.  Do not drink clear liquids: After Midnight.  Take these medicines the morning of surgery with A SIP OF WATER: none   Do not wear jewelry, make-up or nail polish.  Do not wear lotions, powders, or perfumes. Do not wear deodorant.  Do not shave 48 hours prior to surgery.  Do not bring valuables to the hospital.  [facility] is not   responsible for any belongings or valuables brought to the hospital.  Contacts, dentures or bridgework may not be worn into surgery.  Leave suitcase in the car. After surgery it may be brought to your room.  For patients admitted to the hospital, checkout time is 11:00 AM the day of              discharge.   Patients discharged the day of surgery will not be allowed to drive             home.  Name and phone number of your driver: na  Special Instructions:   N/A   Please read over the following fact sheets that you were given:   Surgical Site Infection Prevention

## 2016-02-03 ENCOUNTER — Inpatient Hospital Stay (HOSPITAL_COMMUNITY): Payer: BLUE CROSS/BLUE SHIELD | Admitting: Anesthesiology

## 2016-02-03 ENCOUNTER — Encounter (HOSPITAL_COMMUNITY): Payer: Self-pay

## 2016-02-03 ENCOUNTER — Inpatient Hospital Stay (HOSPITAL_COMMUNITY): Admission: AD | Admit: 2016-02-03 | Payer: Self-pay | Source: Ambulatory Visit | Admitting: Obstetrics and Gynecology

## 2016-02-03 ENCOUNTER — Encounter (HOSPITAL_COMMUNITY)
Admission: RE | Disposition: A | Discharge status: Home / Self Care | Payer: Self-pay | Source: Ambulatory Visit | Attending: Obstetrics and Gynecology

## 2016-02-03 ENCOUNTER — Inpatient Hospital Stay (HOSPITAL_COMMUNITY)
Admission: RE | Admit: 2016-02-03 | Discharge: 2016-02-06 | DRG: 766 | Disposition: A | Discharge status: Home / Self Care | Payer: BLUE CROSS/BLUE SHIELD | Source: Ambulatory Visit | Attending: Obstetrics and Gynecology | Admitting: Obstetrics and Gynecology

## 2016-02-03 DIAGNOSIS — O34211 Maternal care for low transverse scar from previous cesarean delivery: Secondary | ICD-10-CM | POA: Diagnosis not present

## 2016-02-03 DIAGNOSIS — Z3A39 39 weeks gestation of pregnancy: Secondary | ICD-10-CM

## 2016-02-03 LAB — RPR: RPR Ser Ql: NONREACTIVE

## 2016-02-03 SURGERY — Surgical Case
Anesthesia: Spinal | Site: Abdomen | Wound class: Clean Contaminated

## 2016-02-03 MED ORDER — OXYTOCIN 40 UNITS IN LACTATED RINGERS INFUSION - SIMPLE MED: 2.5000 [IU]/h | INTRAVENOUS | Status: AC

## 2016-02-03 MED ORDER — SCOPOLAMINE 1 MG/3DAYS TD PT72
1.0000 | MEDICATED_PATCH | Freq: Once | TRANSDERMAL | Status: DC
  Administered 2016-02-03: 1.5 mg via TRANSDERMAL

## 2016-02-03 MED ORDER — SODIUM CHLORIDE 0.9% FLUSH: 3.0000 mL | INTRAVENOUS | Status: DC | PRN

## 2016-02-03 MED ORDER — MORPHINE SULFATE (PF) 0.5 MG/ML IJ SOLN
INTRAMUSCULAR | Status: DC | PRN
  Administered 2016-02-03: .2 mg via INTRATHECAL

## 2016-02-03 MED ORDER — ONDANSETRON HCL 4 MG/2ML IJ SOLN: 4.0000 mg | Freq: Three times a day (TID) | INTRAMUSCULAR | Status: DC | PRN

## 2016-02-03 MED ORDER — MENTHOL 3 MG MT LOZG: 1.0000 | LOZENGE | OROMUCOSAL | Status: DC | PRN

## 2016-02-03 MED ORDER — SIMETHICONE 80 MG PO CHEW: 80.0000 mg | CHEWABLE_TABLET | ORAL | Status: DC | PRN

## 2016-02-03 MED ORDER — SENNOSIDES-DOCUSATE SODIUM 8.6-50 MG PO TABS
2.0000 | ORAL_TABLET | ORAL | Status: DC
  Administered 2016-02-03 – 2016-02-04 (×2): 2 via ORAL
  Filled 2016-02-03 (×3): qty 2

## 2016-02-03 MED ORDER — NALBUPHINE HCL 10 MG/ML IJ SOLN: 5.0000 mg | Freq: Once | INTRAMUSCULAR | Status: DC | PRN

## 2016-02-03 MED ORDER — DEXAMETHASONE SODIUM PHOSPHATE 4 MG/ML IJ SOLN
INTRAMUSCULAR | Status: DC | PRN
  Administered 2016-02-03: 4 mg via INTRAVENOUS

## 2016-02-03 MED ORDER — COCONUT OIL OIL: 1.0000 "application " | TOPICAL_OIL | Status: DC | PRN

## 2016-02-03 MED ORDER — LACTATED RINGERS IV SOLN
INTRAVENOUS | Status: DC | PRN
  Administered 2016-02-03 (×2): via INTRAVENOUS

## 2016-02-03 MED ORDER — KETOROLAC TROMETHAMINE 30 MG/ML IJ SOLN
INTRAMUSCULAR | Status: AC
  Filled 2016-02-03: qty 1

## 2016-02-03 MED ORDER — PHENYLEPHRINE 8 MG IN D5W 100 ML (0.08MG/ML) PREMIX OPTIME
INJECTION | INTRAVENOUS | Status: DC | PRN
  Administered 2016-02-03: 60 ug/min via INTRAVENOUS

## 2016-02-03 MED ORDER — WITCH HAZEL-GLYCERIN EX PADS: 1.0000 "application " | MEDICATED_PAD | CUTANEOUS | Status: DC | PRN

## 2016-02-03 MED ORDER — LACTATED RINGERS IV SOLN: INTRAVENOUS | Status: DC

## 2016-02-03 MED ORDER — LACTATED RINGERS IV SOLN
INTRAVENOUS | Status: DC
  Administered 2016-02-03: 16:00:00 via INTRAVENOUS

## 2016-02-03 MED ORDER — SIMETHICONE 80 MG PO CHEW
80.0000 mg | CHEWABLE_TABLET | Freq: Three times a day (TID) | ORAL | Status: DC
  Administered 2016-02-03 – 2016-02-05 (×8): 80 mg via ORAL
  Filled 2016-02-03 (×8): qty 1

## 2016-02-03 MED ORDER — NALOXONE HCL 2 MG/2ML IJ SOSY
1.0000 ug/kg/h | PREFILLED_SYRINGE | INTRAMUSCULAR | Status: DC | PRN
  Filled 2016-02-03: qty 2

## 2016-02-03 MED ORDER — NALOXONE HCL 0.4 MG/ML IJ SOLN: 0.4000 mg | INTRAMUSCULAR | Status: DC | PRN

## 2016-02-03 MED ORDER — MEPERIDINE HCL 25 MG/ML IJ SOLN: 6.2500 mg | INTRAMUSCULAR | Status: DC | PRN

## 2016-02-03 MED ORDER — NALBUPHINE HCL 10 MG/ML IJ SOLN: 5.0000 mg | INTRAMUSCULAR | Status: DC | PRN

## 2016-02-03 MED ORDER — LACTATED RINGERS IV SOLN
INTRAVENOUS | Status: DC
  Administered 2016-02-03: 07:00:00 via INTRAVENOUS

## 2016-02-03 MED ORDER — DIPHENHYDRAMINE HCL 25 MG PO CAPS: 25.0000 mg | ORAL_CAPSULE | Freq: Four times a day (QID) | ORAL | Status: DC | PRN

## 2016-02-03 MED ORDER — IBUPROFEN 600 MG PO TABS
600.0000 mg | ORAL_TABLET | Freq: Four times a day (QID) | ORAL | Status: DC
  Administered 2016-02-03 – 2016-02-06 (×11): 600 mg via ORAL
  Filled 2016-02-03 (×11): qty 1

## 2016-02-03 MED ORDER — DIPHENHYDRAMINE HCL 50 MG/ML IJ SOLN: 12.5000 mg | INTRAMUSCULAR | Status: DC | PRN

## 2016-02-03 MED ORDER — ACETAMINOPHEN 325 MG PO TABS
650.0000 mg | ORAL_TABLET | ORAL | Status: DC | PRN
  Filled 2016-02-03: qty 2

## 2016-02-03 MED ORDER — KETOROLAC TROMETHAMINE 30 MG/ML IJ SOLN: 30.0000 mg | Freq: Four times a day (QID) | INTRAMUSCULAR | Status: AC | PRN

## 2016-02-03 MED ORDER — SIMETHICONE 80 MG PO CHEW
80.0000 mg | CHEWABLE_TABLET | ORAL | Status: DC
  Administered 2016-02-03 – 2016-02-05 (×3): 80 mg via ORAL
  Filled 2016-02-03 (×3): qty 1

## 2016-02-03 MED ORDER — KETOROLAC TROMETHAMINE 30 MG/ML IJ SOLN
30.0000 mg | Freq: Four times a day (QID) | INTRAMUSCULAR | Status: AC | PRN
  Administered 2016-02-03: 30 mg via INTRAMUSCULAR

## 2016-02-03 MED ORDER — ONDANSETRON HCL 4 MG/2ML IJ SOLN
INTRAMUSCULAR | Status: DC | PRN
  Administered 2016-02-03: 4 mg via INTRAVENOUS

## 2016-02-03 MED ORDER — TETANUS-DIPHTH-ACELL PERTUSSIS 5-2.5-18.5 LF-MCG/0.5 IM SUSP: 0.5000 mL | Freq: Once | INTRAMUSCULAR | Status: DC

## 2016-02-03 MED ORDER — OXYTOCIN 10 UNIT/ML IJ SOLN
INTRAMUSCULAR | Status: DC | PRN
  Administered 2016-02-03: 40 [IU] via INTRAMUSCULAR

## 2016-02-03 MED ORDER — OXYCODONE-ACETAMINOPHEN 5-325 MG PO TABS
1.0000 | ORAL_TABLET | ORAL | Status: DC | PRN
  Administered 2016-02-03 – 2016-02-04 (×4): 1 via ORAL
  Administered 2016-02-04: 2 via ORAL
  Administered 2016-02-05 (×2): 1 via ORAL
  Administered 2016-02-05 (×3): 2 via ORAL
  Administered 2016-02-05 – 2016-02-06 (×2): 1 via ORAL
  Administered 2016-02-06: 2 via ORAL
  Filled 2016-02-03: qty 1
  Filled 2016-02-03: qty 2
  Filled 2016-02-03 (×2): qty 1
  Filled 2016-02-03: qty 2
  Filled 2016-02-03: qty 1
  Filled 2016-02-03: qty 2
  Filled 2016-02-03: qty 1
  Filled 2016-02-03 (×2): qty 2
  Filled 2016-02-03: qty 1
  Filled 2016-02-03: qty 2
  Filled 2016-02-03: qty 1

## 2016-02-03 MED ORDER — DIPHENHYDRAMINE HCL 25 MG PO CAPS: 25.0000 mg | ORAL_CAPSULE | ORAL | Status: DC | PRN

## 2016-02-03 MED ORDER — SCOPOLAMINE 1 MG/3DAYS TD PT72
1.0000 | MEDICATED_PATCH | Freq: Once | TRANSDERMAL | Status: DC
  Filled 2016-02-03: qty 1

## 2016-02-03 MED ORDER — ZOLPIDEM TARTRATE 5 MG PO TABS: 5.0000 mg | ORAL_TABLET | Freq: Every evening | ORAL | Status: DC | PRN

## 2016-02-03 MED ORDER — FENTANYL CITRATE (PF) 100 MCG/2ML IJ SOLN
INTRAMUSCULAR | Status: DC | PRN
Start: 2016-02-03 — End: 2016-02-03
  Administered 2016-02-03: 87.5 ug via INTRAVENOUS
  Administered 2016-02-03: 12.5 ug via INTRATHECAL

## 2016-02-03 MED ORDER — FENTANYL CITRATE (PF) 100 MCG/2ML IJ SOLN: 25.0000 ug | INTRAMUSCULAR | Status: DC | PRN

## 2016-02-03 MED ORDER — DIBUCAINE 1 % RE OINT: 1.0000 "application " | TOPICAL_OINTMENT | RECTAL | Status: DC | PRN

## 2016-02-03 MED ORDER — BUPIVACAINE IN DEXTROSE 0.75-8.25 % IT SOLN
INTRATHECAL | Status: DC | PRN
  Administered 2016-02-03: 1.3 mL via INTRATHECAL

## 2016-02-03 MED ORDER — PRENATAL MULTIVITAMIN CH
1.0000 | ORAL_TABLET | Freq: Every day | ORAL | Status: DC
  Administered 2016-02-03 – 2016-02-05 (×3): 1 via ORAL
  Filled 2016-02-03 (×3): qty 1

## 2016-02-03 MED ORDER — METOCLOPRAMIDE HCL 5 MG/ML IJ SOLN: 10.0000 mg | Freq: Once | INTRAMUSCULAR | Status: DC | PRN

## 2016-02-03 SURGICAL SUPPLY — 30 items
APL SKNCLS STERI-STRIP NONHPOA (GAUZE/BANDAGES/DRESSINGS) ×1
BENZOIN TINCTURE PRP APPL 2/3 (GAUZE/BANDAGES/DRESSINGS) ×2 IMPLANT
CHLORAPREP W/TINT 26ML (MISCELLANEOUS) ×2 IMPLANT
CLAMP CORD UMBIL (MISCELLANEOUS) IMPLANT
CLOTH BEACON ORANGE TIMEOUT ST (SAFETY) ×2 IMPLANT
DRSG OPSITE POSTOP 4X10 (GAUZE/BANDAGES/DRESSINGS) ×2 IMPLANT
ELECT REM PT RETURN 9FT ADLT (ELECTROSURGICAL) ×2
ELECTRODE REM PT RTRN 9FT ADLT (ELECTROSURGICAL) ×1 IMPLANT
EXTRACTOR VACUUM M CUP 4 TUBE (SUCTIONS) IMPLANT
GLOVE BIOGEL PI IND STRL 7.0 (GLOVE) ×1 IMPLANT
GLOVE BIOGEL PI INDICATOR 7.0 (GLOVE) ×1
GLOVE SURG ORTHO 8.0 STRL STRW (GLOVE) ×2 IMPLANT
GOWN STRL REUS W/TWL LRG LVL3 (GOWN DISPOSABLE) ×4 IMPLANT
KIT ABG SYR 3ML LUER SLIP (SYRINGE) ×2 IMPLANT
NDL HYPO 25X5/8 SAFETYGLIDE (NEEDLE) ×1 IMPLANT
NEEDLE HYPO 25X5/8 SAFETYGLIDE (NEEDLE) ×2 IMPLANT
NS IRRIG 1000ML POUR BTL (IV SOLUTION) ×2 IMPLANT
PACK C SECTION WH (CUSTOM PROCEDURE TRAY) ×2 IMPLANT
PAD OB MATERNITY 4.3X12.25 (PERSONAL CARE ITEMS) ×2 IMPLANT
PENCIL SMOKE EVAC W/HOLSTER (ELECTROSURGICAL) ×2 IMPLANT
STRIP CLOSURE SKIN 1/2X4 (GAUZE/BANDAGES/DRESSINGS) ×1 IMPLANT
SUT MNCRL 0 VIOLET CTX 36 (SUTURE) ×3 IMPLANT
SUT MON AB 4-0 PS1 27 (SUTURE) ×2 IMPLANT
SUT MONOCRYL 0 CTX 36 (SUTURE) ×3
SUT PDS AB 1 CT  36 (SUTURE)
SUT PDS AB 1 CT 36 (SUTURE) IMPLANT
SUT VIC AB 1 CTX 36 (SUTURE)
SUT VIC AB 1 CTX36XBRD ANBCTRL (SUTURE) IMPLANT
TOWEL OR 17X24 6PK STRL BLUE (TOWEL DISPOSABLE) ×2 IMPLANT
TRAY FOLEY CATH SILVER 14FR (SET/KITS/TRAYS/PACK) ×2 IMPLANT

## 2016-02-03 NOTE — Anesthesia Preprocedure Evaluation (Signed)
Anesthesia Evaluation  Patient identified by MRN, date of birth, ID band Patient awake    Reviewed: Allergy & Precautions, NPO status , Patient's Chart, lab work & pertinent test results  Airway Mallampati: II  TM Distance: >3 FB Neck ROM: Full    Dental no notable dental hx.    Pulmonary neg pulmonary ROS,    Pulmonary exam normal breath sounds clear to auscultation       Cardiovascular negative cardio ROS Normal cardiovascular exam Rhythm:Regular Rate:Normal     Neuro/Psych negative neurological ROS  negative psych ROS   GI/Hepatic negative GI ROS, Neg liver ROS,   Endo/Other  negative endocrine ROS  Renal/GU negative Renal ROS  negative genitourinary   Musculoskeletal negative musculoskeletal ROS (+)   Abdominal   Peds negative pediatric ROS (+)  Hematology negative hematology ROS (+)   Anesthesia Other Findings   Reproductive/Obstetrics (+) Pregnancy                             Anesthesia Physical Anesthesia Plan  ASA: II  Anesthesia Plan: Spinal   Post-op Pain Management:    Induction: Intravenous  Airway Management Planned: Natural Airway  Additional Equipment:   Intra-op Plan:   Post-operative Plan:   Informed Consent: I have reviewed the patients History and Physical, chart, labs and discussed the procedure including the risks, benefits and alternatives for the proposed anesthesia with the patient or authorized representative who has indicated his/her understanding and acceptance.   Dental advisory given  Plan Discussed with: CRNA  Anesthesia Plan Comments:         Anesthesia Quick Evaluation  

## 2016-02-03 NOTE — Transfer of Care (Signed)
Immediate Anesthesia Transfer of Care Note  Patient: Tina Ramirez  Procedure(s) Performed: Procedure(s) with comments: CESAREAN SECTION (N/A) - Repeat edc 02/09/16 NKDA Needs RNFA  Patient Location: PACU  Anesthesia Type:Spinal  Level of Consciousness: awake, alert , oriented and patient cooperative  Airway & Oxygen Therapy: Patient Spontanous Breathing  Post-op Assessment: Report given to RN and Post -op Vital signs reviewed and stable  Post vital signs: Reviewed and stable  Last Vitals:  TEMP 97.9 BP 103/74 HR 70 RR 18 POX 99  Last Pain: 0     Patients Stated Pain Goal: 3 (02/03/16 0617)  Complications: No apparent anesthesia complications

## 2016-02-03 NOTE — Anesthesia Procedure Notes (Signed)
Spinal  Patient location during procedure: OR Staffing Anesthesiologist: Montez Hageman Performed: anesthesiologist  Preanesthetic Checklist Completed: patient identified, site marked, surgical consent, pre-op evaluation, timeout performed, IV checked, risks and benefits discussed and monitors and equipment checked Spinal Block Patient position: sitting Prep: ChloraPrep Patient monitoring: heart rate, continuous pulse ox and blood pressure Approach: midline Location: L3-4 Injection technique: single-shot Needle Needle type: Sprotte  Needle gauge: 24 G Needle length: 9 cm Additional Notes Expiration date of kit checked and confirmed. Patient tolerated procedure well, without complications.

## 2016-02-03 NOTE — Op Note (Signed)
Cesarean Section Procedure Note  Pre-operative Diagnosis: IUP at 39 weeks, Prev C/S x 2 for repeat  Post-operative Diagnosis: same  Surgeon: Turner Daniels   Assistants: RNFA  Anesthesia: spinal  Procedure:  Low Segment Transverse cesarean section  Procedure Details  The patient was seen in the Holding Room. The risks, benefits, complications, treatment options, and expected outcomes were discussed with the patient.  The patient concurred with the proposed plan, giving informed consent.  The site of surgery properly noted/marked.. A Time Out was held and the above information confirmed.  After induction of anesthesia, the patient was draped and prepped in the usual sterile manner. A Pfannenstiel incision was made and carried down through the subcutaneous tissue to the fascia. Fascial incision was made and extended transversely. The fascia was separated from the underlying rectus tissue superiorly and inferiorly. The peritoneum was identified and entered. Peritoneal incision was extended longitudinally. The utero-vesical peritoneal reflection was incised transversely and the bladder flap was bluntly freed from the lower uterine segment. A low transverse uterine incision was made. Delivered from vertex presentation was a baby with Apgar scores of 8 at one minute and 9 at five minutes. After the umbilical cord was clamped and cut cord blood was obtained for evaluation. The placenta was removed intact and appeared normal. The uterine outline, tubes and ovaries appeared normal. The uterine incision was closed with running locked sutures of 0 monocryl and imbricated with 0 monocryl. Hemostasis was observed. Lavage was carried out until clear. The peritoneum was then closed with 0 monocryl and rectus muscles plicated in the midline.  After hemostasis was assured, the fascia was then reapproximated with running sutures of 0 Vicryl. Irrigation was applied and after adequate hemostasis was assured, the skin was  reapproximated with subcutaneous sutures using 4-0 monocryl.  Instrument, sponge, and needle counts were correct prior the abdominal closure and at the conclusion of the case. The patient received 2 grams cefotetan preoperatively.  Findings: Viable female  Estimated Blood Loss:  650cc         Specimens: Placenta was sent to labor and delivery         Complications:  None

## 2016-02-03 NOTE — Anesthesia Postprocedure Evaluation (Signed)
Anesthesia Post Note  Patient: Elnita MaxwellMary S Benard  Procedure(s) Performed: Procedure(s) (LRB): CESAREAN SECTION (N/A)  Patient location during evaluation: PACU Anesthesia Type: Spinal Level of consciousness: oriented and awake and alert Pain management: pain level controlled Vital Signs Assessment: post-procedure vital signs reviewed and stable Respiratory status: spontaneous breathing, respiratory function stable and patient connected to nasal cannula oxygen Cardiovascular status: blood pressure returned to baseline and stable Postop Assessment: no headache and no backache Anesthetic complications: no     Last Vitals:  Vitals:   02/03/16 1215 02/03/16 1300  BP: 126/72 103/71  Pulse: (!) 57 (!) 59  Resp: 18 18  Temp: 36.7 C 36.6 C    Last Pain:  Vitals:   02/03/16 1300  TempSrc: Oral  PainSc: 3    Pain Goal: Patients Stated Pain Goal: 5 (02/03/16 1005)               Eleyna Brugh

## 2016-02-04 LAB — CBC
HEMATOCRIT: 22.7 % — AB (ref 36.0–46.0)
HEMOGLOBIN: 7.8 g/dL — AB (ref 12.0–15.0)
MCH: 32.5 pg (ref 26.0–34.0)
MCHC: 34.4 g/dL (ref 30.0–36.0)
MCV: 94.6 fL (ref 78.0–100.0)
Platelets: 120 10*3/uL — ABNORMAL LOW (ref 150–400)
RBC: 2.4 MIL/uL — ABNORMAL LOW (ref 3.87–5.11)
RDW: 14 % (ref 11.5–15.5)
WBC: 9.4 10*3/uL (ref 4.0–10.5)

## 2016-02-04 LAB — BIRTH TISSUE RECOVERY COLLECTION (PLACENTA DONATION)

## 2016-02-04 NOTE — Progress Notes (Signed)
Subjective: Postpartum Day 1: Cesarean Delivery Patient reports tolerating PO and no problems voiding.   Desires baby circ prior to discharge Objective: Vital signs in last 24 hours: Temp:  [97.8 F (36.6 C)-98.7 F (37.1 C)] 98.7 F (37.1 C) (10/18 0603) Pulse Rate:  [57-73] 72 (10/18 0603) Resp:  [14-18] 18 (10/18 0603) BP: (103-134)/(56-102) 112/67 (10/18 0603) SpO2:  [95 %-100 %] 99 % (10/18 0603)  Physical Exam:  General: alert and cooperative Lochia: appropriate Uterine Fundus: firm Incision: healing well DVT Evaluation: No evidence of DVT seen on physical exam. Negative Homan's sign. No cords or calf tenderness. No significant calf/ankle edema.   Recent Labs  02/02/16 1220 02/04/16 0542  HGB 10.7* 7.8*  HCT 32.0* 22.7*    Assessment/Plan: Status post Cesarean section. Doing well postoperatively.  Continue current care.  CURTIS,CAROL G 02/04/2016, 8:12 AM

## 2016-02-05 LAB — COMPREHENSIVE METABOLIC PANEL
ALBUMIN: 2.6 g/dL — AB (ref 3.5–5.0)
ALK PHOS: 94 U/L (ref 38–126)
ALT: 17 U/L (ref 14–54)
ANION GAP: 7 (ref 5–15)
AST: 35 U/L (ref 15–41)
BUN: 9 mg/dL (ref 6–20)
CALCIUM: 8.4 mg/dL — AB (ref 8.9–10.3)
CHLORIDE: 105 mmol/L (ref 101–111)
CO2: 25 mmol/L (ref 22–32)
Creatinine, Ser: 0.52 mg/dL (ref 0.44–1.00)
GFR calc non Af Amer: >60 mL/min (ref >60)
GLUCOSE: 82 mg/dL (ref 65–99)
POTASSIUM: 4 mmol/L (ref 3.5–5.1)
SODIUM: 137 mmol/L (ref 135–145)
Total Bilirubin: 0.5 mg/dL (ref 0.3–1.2)
Total Protein: 5.8 g/dL — ABNORMAL LOW (ref 6.5–8.1)

## 2016-02-05 LAB — CBC
HCT: 24.4 % — ABNORMAL LOW (ref 36.0–46.0)
Hemoglobin: 8.5 g/dL — ABNORMAL LOW (ref 12.0–15.0)
MCH: 33.6 pg (ref 26.0–34.0)
MCHC: 34.8 g/dL (ref 30.0–36.0)
MCV: 96.4 fL (ref 78.0–100.0)
PLATELETS: 145 10*3/uL — AB (ref 150–400)
RBC: 2.53 MIL/uL — ABNORMAL LOW (ref 3.87–5.11)
RDW: 14.5 % (ref 11.5–15.5)
WBC: 9.4 10*3/uL (ref 4.0–10.5)

## 2016-02-05 MED ORDER — BISACODYL 10 MG RE SUPP
10.0000 mg | Freq: Once | RECTAL | Status: AC
  Administered 2016-02-05: 10 mg via RECTAL
  Filled 2016-02-05: qty 1

## 2016-02-05 NOTE — Progress Notes (Signed)
Patient continues to have RUQ pain despite pain medications.  She has no n/v.  Voiding without difficulty. Pain only present when uses abdominal muscles.  Sitting upright in chair presently and no pain.  No flatus since yesterday.    VSS. AF. Gen: NAD Abd: Distended.  TTP in RUQ only.  No referred pain with palpation in other areas.   Ext: no c/c/e  POD#2 with RUQ pain -CBC, CMP -Closely monitor  Mitchel HonourMegan Naylani Bradner, DO

## 2016-02-05 NOTE — Discharge Instructions (Signed)
Iron-Rich Diet Iron is a mineral that helps your body to produce hemoglobin. Hemoglobin is a protein in your red blood cells that carries oxygen to your body's tissues. Eating too little iron may cause you to feel weak and tired, and it can increase your risk for infection. Eating enough iron is necessary for your body's metabolism, muscle function, and nervous system. Iron is naturally found in many foods. It can also be added to foods or fortified in foods. There are two types of dietary iron:  Heme iron. Heme iron is absorbed by the body more easily than nonheme iron. Heme iron is found in meat, poultry, and fish.  Nonheme iron. Nonheme iron is found in dietary supplements, iron-fortified grains, beans, and vegetables. You may need to follow an iron-rich diet if:  You have been diagnosed with iron deficiency or iron-deficiency anemia.  You have a condition that prevents you from absorbing dietary iron, such as:  Infection in your intestines.  Celiac disease. This involves long-lasting (chronic) inflammation of your intestines.  You do not eat enough iron.  You eat a diet that is high in foods that impair iron absorption.  You have lost a lot of blood.  You have heavy bleeding during your menstrual cycle.  You are pregnant. WHAT IS MY PLAN? Your health care provider may help you to determine how much iron you need per day based on your condition. Generally, when a person consumes sufficient amounts of iron in the diet, the following iron needs are met:  Men.  14-18 years old: 11 mg per day.  19-50 years old: 8 mg per day.  Women.   14-18 years old: 15 mg per day.  19-50 years old: 18 mg per day.  Over 50 years old: 8 mg per day.  Pregnant women: 27 mg per day.  Breastfeeding women: 9 mg per day. WHAT DO I NEED TO KNOW ABOUT AN IRON-RICH DIET?  Eat fresh fruits and vegetables that are high in vitamin C along with foods that are high in iron. This will help increase  the amount of iron that your body absorbs from food, especially with foods containing nonheme iron. Foods that are high in vitamin C include oranges, peppers, tomatoes, and mango.  Take iron supplements only as directed by your health care provider. Overdose of iron can be life-threatening. If you were prescribed iron supplements, take them with orange juice or a vitamin C supplement.  Cook foods in pots and pans that are made from iron.   Eat nonheme iron-containing foods alongside foods that are high in heme iron. This helps to improve your iron absorption.   Certain foods and drinks contain compounds that impair iron absorption. Avoid eating these foods in the same meal as iron-rich foods or with iron supplements. These include:  Coffee, black tea, and red wine.  Milk, dairy products, and foods that are high in calcium.  Beans, soybeans, and peas.  Whole grains.  When eating foods that contain both nonheme iron and compounds that impair iron absorption, follow these tips to absorb iron better.   Soak beans overnight before cooking.  Soak whole grains overnight and drain them before using.  Ferment flours before baking, such as using yeast in bread dough. WHAT FOODS CAN I EAT? Grains Iron-fortified breakfast cereal. Iron-fortified whole-wheat bread. Enriched rice. Sprouted grains. Vegetables Spinach. Potatoes with skin. Green peas. Broccoli. Red and green bell peppers. Fermented vegetables. Fruits Prunes. Raisins. Oranges. Strawberries. Mango. Grapefruit. Meats and Other Protein   Sources Beef liver. Oysters. Beef. Shrimp. Turkey. Chicken. Tuna. Sardines. Chickpeas. Nuts. Tofu. Beverages Tomato juice. Fresh orange juice. Prune juice. Hibiscus tea. Fortified instant breakfast shakes. Condiments Tahini. Fermented soy sauce. Sweets and Desserts Black-strap molasses.  Other Wheat germ. The items listed above may not be a complete list of recommended foods or  beverages. Contact your dietitian for more options. WHAT FOODS ARE NOT RECOMMENDED? Grains Whole grains. Bran cereal. Bran flour. Oats. Vegetables Artichokes. Brussels sprouts. Kale. Fruits Blueberries. Raspberries. Strawberries. Figs. Meats and Other Protein Sources Soybeans. Products made from soy protein. Dairy Milk. Cream. Cheese. Yogurt. Cottage cheese. Beverages Coffee. Black tea. Red wine. Sweets and Desserts Cocoa. Chocolate. Ice cream. Other Basil. Oregano. Parsley. The items listed above may not be a complete list of foods and beverages to avoid. Contact your dietitian for more information.   This information is not intended to replace advice given to you by your health care provider. Make sure you discuss any questions you have with your health care provider.   Document Released: 11/17/2004 Document Revised: 04/26/2014 Document Reviewed: 10/31/2013 Elsevier Interactive Patient Education 2016 [employer]  

## 2016-02-05 NOTE — Progress Notes (Signed)
Subjective: Postpartum Day 2: Cesarean Delivery Patient reports tolerating PO, + flatus and no problems voiding.   Reports pain RUQ. No complaints of HA, or blurred vision  Desires late discharge today if pain resolves Objective: Vital signs in last 24 hours: Temp:  [98.2 F (36.8 C)-98.6 F (37 C)] 98.6 F (37 C) (10/19 0600) Pulse Rate:  [72] 72 (10/19 0600) Resp:  [18] 18 (10/19 0600) BP: (99-106)/(65-71) 106/71 (10/19 0600)  Physical Exam:  General: alert and cooperative Lochia: appropriate Uterine Fundus: firm Incision: healing well DVT Evaluation: No evidence of DVT seen on physical exam. Negative Homan's sign. No cords or calf tenderness. No significant calf/ankle edema. Tenderness RUQ with palpation, probable gas trapped  Recent Labs  02/02/16 1220 02/04/16 0542  HGB 10.7* 7.8*  HCT 32.0* 22.7*    Assessment/Plan: Status post Cesarean section. Postoperative course complicated by RUQ pain  Continue current care  Written rx given for Percocet, Encouraged warm beverages and ambulation.  CURTIS,CAROL G 02/05/2016, 8:51 AM

## 2016-02-05 NOTE — Progress Notes (Signed)
Dr. Langston MaskerMorris called with pt temp of 100.1 taken at 1730. No new orders received at this time.

## 2016-02-06 MED ORDER — OXYCODONE-ACETAMINOPHEN 5-325 MG PO TABS: 1.0000 | ORAL_TABLET | ORAL | 0 refills | Status: DC | PRN

## 2016-02-06 MED ORDER — IBUPROFEN 600 MG PO TABS
600.0000 mg | ORAL_TABLET | Freq: Four times a day (QID) | ORAL | 1 refills | Status: DC
Start: 2016-02-06 — End: 2016-09-20

## 2016-02-06 NOTE — Discharge Summary (Signed)
Obstetric Discharge Summary Reason for Admission: cesarean section Prenatal Procedures: ultrasound Intrapartum Procedures: cesarean: low cervical, transverse Postpartum Procedures: none Complications-Operative and Postpartum: none Hemoglobin  Date Value Ref Range Status  02/05/2016 8.5 (L) 12.0 - 15.0 g/dL Final   HCT  Date Value Ref Range Status  02/05/2016 24.4 (L) 36.0 - 46.0 % Final    Physical Exam:  General: alert and cooperative Lochia: appropriate Uterine Fundus: firm Incision: healing well DVT Evaluation: No evidence of DVT seen on physical exam. Negative Homan's sign. No cords or calf tenderness. No significant calf/ankle edema.  Discharge Diagnoses: Term Pregnancy-delivered  Discharge Information: Date: 02/06/2016 Activity: pelvic rest Diet: routine Medications: PNV, Ibuprofen and Percocet Condition: stable Instructions: refer to practice specific booklet Discharge to: home   Newborn Data: Live born female  Birth Weight: 7 lb 14.5 oz (3585 g) APGAR: 8, 9  Home with mother.  Anna-Marie Coller G 02/06/2016, 9:03 AM

## 2016-03-16 DIAGNOSIS — Z1389 Encounter for screening for other disorder: Secondary | ICD-10-CM | POA: Diagnosis not present

## 2016-04-07 DIAGNOSIS — N921 Excessive and frequent menstruation with irregular cycle: Secondary | ICD-10-CM | POA: Diagnosis not present

## 2016-04-07 DIAGNOSIS — N924 Excessive bleeding in the premenopausal period: Secondary | ICD-10-CM | POA: Diagnosis not present

## 2016-05-14 DIAGNOSIS — J40 Bronchitis, not specified as acute or chronic: Secondary | ICD-10-CM | POA: Diagnosis not present

## 2016-05-14 DIAGNOSIS — R509 Fever, unspecified: Secondary | ICD-10-CM | POA: Diagnosis not present

## 2016-05-14 DIAGNOSIS — R0981 Nasal congestion: Secondary | ICD-10-CM | POA: Diagnosis not present

## 2016-05-26 ENCOUNTER — Encounter: Payer: Self-pay | Admitting: Family Medicine

## 2016-05-26 ENCOUNTER — Ambulatory Visit (INDEPENDENT_AMBULATORY_CARE_PROVIDER_SITE_OTHER): Payer: BLUE CROSS/BLUE SHIELD | Admitting: Family Medicine

## 2016-05-26 VITALS — Temp 98.4°F | Ht 61.0 in | Wt 106.4 lb

## 2016-05-26 DIAGNOSIS — J019 Acute sinusitis, unspecified: Secondary | ICD-10-CM | POA: Diagnosis not present

## 2016-05-26 DIAGNOSIS — J209 Acute bronchitis, unspecified: Secondary | ICD-10-CM

## 2016-05-26 DIAGNOSIS — B9689 Other specified bacterial agents as the cause of diseases classified elsewhere: Secondary | ICD-10-CM | POA: Diagnosis not present

## 2016-05-26 MED ORDER — CEFDINIR 300 MG PO CAPS: 300.0000 mg | ORAL_CAPSULE | Freq: Two times a day (BID) | ORAL | 0 refills | Status: DC

## 2016-05-26 MED ORDER — FLUCONAZOLE 150 MG PO TABS: ORAL_TABLET | ORAL | 0 refills | Status: DC

## 2016-05-26 NOTE — Progress Notes (Signed)
   Subjective:    Patient ID: Floyce StakesMary S Bentsen, female    DOB: May 06, 1975, 41 y.o.   MRN: [id number]  Cough  This is a new problem. The current episode started in the past 7 days. Associated symptoms include nasal congestion. Treatments tried: decongestant.   Developed cough and cong  [facility]   Got an antiobiotic and a shot  Ears felt muffled and full both ears  Cong and yellow now left ear pain  Cough comes and goes, pos productive  Headache frontal in nature Review of Systems  Respiratory: Positive for cough.    No vomiting or diarrhea    Objective:   Physical Exam  Alert, mild malaise. Hydration good Vitals stable. frontal/ maxillary tenderness evident positive nasal congestion. pharynx normal neck supple  lungs clear/no crackles or wheezes. heart regular in rhythm       Assessment & Plan:  Impression rhinosinusitis likely post viral, discussed with patient. plan antibiotics prescribed. Questions answered. Symptomatic care discussed. warning signs discussed. WSL

## 2016-06-07 ENCOUNTER — Other Ambulatory Visit: Payer: Self-pay | Admitting: *Deleted

## 2016-06-07 ENCOUNTER — Telehealth: Payer: Self-pay | Admitting: Family Medicine

## 2016-06-07 MED ORDER — FLUCONAZOLE 150 MG PO TABS: ORAL_TABLET | ORAL | 0 refills | Status: DC

## 2016-06-07 NOTE — Telephone Encounter (Signed)
Patient has a yeast infection due to recent antibiotic use.  She said she tried the diflucan she was prescribed on 05/26/16 by Dr. Brett CanalesSteve, but says she thinks she needs another one.   [facility]

## 2016-06-07 NOTE — Telephone Encounter (Signed)
Took diflucan at the beginning of taking antibiotic before having any symptoms. Pt states she is now having white discharge and vaginal itching. Pt advised to follow up with office visit if symptoms do not clear up with second round of diflucan. Diflucan 150mg  #2 one po 3 days apart per dr Lorin Picketscott.

## 2016-06-25 ENCOUNTER — Encounter: Payer: Self-pay | Admitting: Family Medicine

## 2016-06-25 ENCOUNTER — Ambulatory Visit (INDEPENDENT_AMBULATORY_CARE_PROVIDER_SITE_OTHER): Payer: BLUE CROSS/BLUE SHIELD | Admitting: Family Medicine

## 2016-06-25 VITALS — BP 110/70 | Temp 98.4°F | Ht 61.0 in | Wt 105.1 lb

## 2016-06-25 DIAGNOSIS — J019 Acute sinusitis, unspecified: Secondary | ICD-10-CM

## 2016-06-25 DIAGNOSIS — J111 Influenza due to unidentified influenza virus with other respiratory manifestations: Secondary | ICD-10-CM | POA: Diagnosis not present

## 2016-06-25 MED ORDER — DOXYCYCLINE HYCLATE 100 MG PO CAPS: 100.0000 mg | ORAL_CAPSULE | Freq: Two times a day (BID) | ORAL | 0 refills | Status: DC

## 2016-06-25 NOTE — Patient Instructions (Signed)

## 2016-06-25 NOTE — Progress Notes (Signed)
   Subjective:    Patient ID: Tina Ramirez, female    DOB: 10-Feb-1976, 41 y.o.   MRN: [id number]  Sinusitis  This is a new problem. The current episode started in the past 7 days. The problem is unchanged. Associated symptoms include congestion, coughing, headaches and a sore throat. Pertinent negatives include no ear pain or shortness of breath. (Runny nose, body aches) Treatments tried: advil. The treatment provided no relief.   Has had the illness for several days now with some head congestion sinus pressure not feeling good she relates starting off with body aches headaches some low-grade fevers but now mainly with head congestion and drainage   Review of Systems  Constitutional: Negative for activity change and fever.  HENT: Positive for congestion, rhinorrhea and sore throat. Negative for ear pain.   Eyes: Negative for discharge.  Respiratory: Positive for cough. Negative for shortness of breath and wheezing.   Cardiovascular: Negative for chest pain.  Neurological: Positive for headaches.       Objective:   Physical Exam  Constitutional: She appears well-developed.  HENT:  Head: Normocephalic.  Nose: Nose normal.  Mouth/Throat: Oropharynx is clear and moist. No oropharyngeal exudate.  Neck: Neck supple.  Cardiovascular: Normal rate and normal heart sounds.   No murmur heard. Pulmonary/Chest: Effort normal and breath sounds normal. She has no wheezes.  Lymphadenopathy:    She has no cervical adenopathy.  Skin: Skin is warm and dry.  Nursing note and vitals reviewed.         Assessment & Plan:  She is beyond the window where Tamiflu can help  Flulike illness Sinusitis Antibiotics prescribed warning signs discussed follow-up if progressive troubles

## 2016-08-04 ENCOUNTER — Telehealth: Payer: Self-pay | Admitting: Family Medicine

## 2016-08-04 ENCOUNTER — Other Ambulatory Visit: Payer: Self-pay | Admitting: Family Medicine

## 2016-08-04 MED ORDER — CEFDINIR 300 MG PO CAPS: 300.0000 mg | ORAL_CAPSULE | Freq: Two times a day (BID) | ORAL | 0 refills | Status: DC

## 2016-08-04 NOTE — Telephone Encounter (Signed)
Antibiotic was accidentally sent to [facility] please call their pharmacy leave a message on the machine to cancel the antibiotic prescription Omnicef (the father instructed me afterwards to send it to [facility] pharmacy which I did)

## 2016-08-04 NOTE — Progress Notes (Signed)
Has sinus sx, omnicef 2 times a day, follow up if problems ( unable to come)

## 2016-08-04 NOTE — Telephone Encounter (Signed)
Spoke with [facility]. They will contact [facility] [place] to Have Rx transferred

## 2016-08-24 ENCOUNTER — Other Ambulatory Visit: Payer: Self-pay | Admitting: Family Medicine

## 2016-08-24 ENCOUNTER — Telehealth: Payer: Self-pay | Admitting: Family Medicine

## 2016-08-24 MED ORDER — ONDANSETRON 8 MG PO TBDP: 8.0000 mg | ORAL_TABLET | Freq: Three times a day (TID) | ORAL | 1 refills | Status: DC | PRN

## 2016-08-24 NOTE — Telephone Encounter (Signed)
Sent in,follow up if ongoing-zofran odt

## 2016-08-24 NOTE — Telephone Encounter (Signed)
Pt is needing zofran called in.    [facility]

## 2016-09-20 ENCOUNTER — Ambulatory Visit (INDEPENDENT_AMBULATORY_CARE_PROVIDER_SITE_OTHER): Payer: BLUE CROSS/BLUE SHIELD | Admitting: Nurse Practitioner

## 2016-09-20 ENCOUNTER — Encounter: Payer: Self-pay | Admitting: Nurse Practitioner

## 2016-09-20 VITALS — BP 106/70 | Ht 61.0 in | Wt 101.8 lb

## 2016-09-20 DIAGNOSIS — F5105 Insomnia due to other mental disorder: Secondary | ICD-10-CM

## 2016-09-20 DIAGNOSIS — F411 Generalized anxiety disorder: Secondary | ICD-10-CM | POA: Diagnosis not present

## 2016-09-20 DIAGNOSIS — F419 Anxiety disorder, unspecified: Secondary | ICD-10-CM | POA: Diagnosis not present

## 2016-09-20 MED ORDER — ALPRAZOLAM 0.5 MG PO TABS: 0.5000 mg | ORAL_TABLET | Freq: Every evening | ORAL | 0 refills | Status: DC | PRN

## 2016-09-20 MED ORDER — FLUOXETINE HCL 20 MG PO TABS: 20.0000 mg | ORAL_TABLET | Freq: Every day | ORAL | 2 refills | Status: DC

## 2016-09-20 NOTE — Patient Instructions (Signed)
[place] book on pediatric sleep

## 2016-09-20 NOTE — Progress Notes (Signed)
Subjective:  Presents for recheck on her anxiety. Has had problems with this in the past. Was doing well until lately. Has a 513-month-old baby at home. Wakes her up 2-3 times per night. Now sleeping in her bed since he was keeping her up all night. Infant is restless. Also patient cannot shut her mind down to get sleep. Stays tired all the time. Wants to sleep. Irritability. Decreased appetite with some nausea. Is not breast-feeding. Denies suicidal or homicidal thoughts or ideation. Has taken Prozac in the past which has worked well. Also has taken low-dose Xanax for sleep if needed.   Objective:   BP 106/70   Ht 5\' 1"  (1.549 m)   Wt 101 lb 12.8 oz (46.2 kg)   BMI 19.23 kg/m  NAD. Alert, oriented. Thoughts logical coherent and relevant. Tearful at times. Dressed appropriately. Making good eye contact. Lungs clear. Heart regular rate rhythm.  Assessment:   Problem List Items Addressed This Visit      Other   Generalized anxiety disorder - Primary   Insomnia secondary to anxiety   Relevant Medications   FLUoxetine (PROZAC) 20 MG tablet   ALPRAZolam (XANAX) 0.5 MG tablet       Plan:   Meds ordered this encounter  Medications  . FLUoxetine (PROZAC) 20 MG tablet    Sig: Take 1 tablet (20 mg total) by mouth daily.    Dispense:  30 tablet    Refill:  2    Order Specific Question:   Supervising Provider    Answer:   Merlyn AlbertLUKING, WILLIAM S [[id number]]  . ALPRAZolam (XANAX) 0.5 MG tablet    Sig: Take 1 tablet (0.5 mg total) by mouth at bedtime as needed for sleep.    Dispense:  30 tablet    Refill:  0    Order Specific Question:   Supervising Provider    Answer:   Merlyn AlbertLUKING, WILLIAM S [[id number]]    Restart Prozac at low dose, contact office within one-2 weeks if dose needs to be increased. Given prescription for low-dose Xanax, consider taking a half to one pill as needed for sleep. Cautioned patient about safety issues with her infant sleeping in her bed especially if she takes medication for sleep.  Verbalizes understanding. Also may be experiencing some postpartum depression symptoms. Return in about 3 months (around 12/21/2016) for recheck. Call back sooner if any problems. Over 50% of this visit was spent in consultation and discussion.

## 2016-09-23 ENCOUNTER — Encounter: Payer: Self-pay | Admitting: Nurse Practitioner

## 2016-09-23 ENCOUNTER — Encounter: Payer: Self-pay | Admitting: Family Medicine

## 2016-12-21 ENCOUNTER — Ambulatory Visit: Payer: BLUE CROSS/BLUE SHIELD | Admitting: Nurse Practitioner

## 2016-12-22 ENCOUNTER — Other Ambulatory Visit: Payer: Self-pay | Admitting: Nurse Practitioner

## 2016-12-22 NOTE — Telephone Encounter (Signed)
Last seen 09/20/16

## 2017-01-24 ENCOUNTER — Encounter: Payer: Self-pay | Admitting: Nurse Practitioner

## 2017-01-24 ENCOUNTER — Encounter: Payer: Self-pay | Admitting: Family Medicine

## 2017-01-24 ENCOUNTER — Ambulatory Visit (INDEPENDENT_AMBULATORY_CARE_PROVIDER_SITE_OTHER): Payer: BLUE CROSS/BLUE SHIELD | Admitting: Nurse Practitioner

## 2017-01-24 VITALS — BP 112/76 | Temp 98.6°F | Ht 61.0 in | Wt 104.2 lb

## 2017-01-24 DIAGNOSIS — J209 Acute bronchitis, unspecified: Secondary | ICD-10-CM

## 2017-01-24 DIAGNOSIS — J069 Acute upper respiratory infection, unspecified: Secondary | ICD-10-CM

## 2017-01-24 MED ORDER — AZITHROMYCIN 250 MG PO TABS: ORAL_TABLET | ORAL | 0 refills | Status: DC

## 2017-01-24 MED ORDER — HYDROCODONE-HOMATROPINE 5-1.5 MG/5ML PO SYRP: 5.0000 mL | ORAL_SOLUTION | ORAL | 0 refills | Status: DC | PRN

## 2017-01-24 NOTE — Progress Notes (Signed)
Subjective:  Presents for complaints of sore throat that began on 10/3. Since then has developed cough and congestion. Hoarseness began 3 days ago. Fever has resolved. Sore throat mainly with cough. Headache with cough. Frequent spells of uncontrollable coughing producing yellow green sputum. Left ear pressure. Malaise. No wheezing. No vomiting diarrhea abdominal pain. Taking fluids well. Voiding normal limit. Is no longer breast-feeding.  Objective:   BP 112/76   Temp 98.6 F (37 C) (Oral)   Ht  (1.549 m)   Wt 104 lb 3.2 oz (47.3 kg)   BMI 19.69 kg/m  NAD. Alert, oriented. TMs clear effusion, no erythema. Posterior pharynx erythematous with mild edema, no exudate. PND noted. Neck supple with mild soft anterior slightly tender adenopathy. Lungs scattered expiratory crackles mainly on the right side, no wheezing or tachypnea. Heart regular rate rhythm.  Assessment:   Problem List Items Addressed This Visit    None    Visit Diagnoses    Acute upper respiratory infection    -  Primary   Relevant Medications   azithromycin (ZITHROMAX Z-PAK) 250 MG tablet   Acute bronchitis, unspecified organism           Plan:   Meds ordered this encounter  Medications  . azithromycin (ZITHROMAX Z-PAK) 250 MG tablet    Sig: Take 2 tablets (500 mg) on  Day 1,  followed by 1 tablet (250 mg) once daily on Days 2 through 5.    Dispense:  6 each    Refill:  0    Order Specific Question:   Supervising Provider    Answer:   Merlyn Albert [[id number]]  . HYDROcodone-homatropine (HYCODAN) 5-1.5 MG/5ML syrup    Sig: Take 5 mLs by mouth every 4 (four) hours as needed.    Dispense:  90 mL    Refill:  0    Order Specific Question:   Supervising Provider    Answer:   Merlyn Albert [[id number]]   Drowsiness precautions with Hycodan syrup. OTC meds as directed for congestion and cough. Warning signs reviewed. Call back by then the week if no improvement, sooner if worse.

## 2017-05-23 ENCOUNTER — Other Ambulatory Visit: Payer: Self-pay | Admitting: Nurse Practitioner

## 2017-05-23 ENCOUNTER — Encounter: Payer: Self-pay | Admitting: Nurse Practitioner

## 2017-05-23 MED ORDER — FLUOXETINE HCL 20 MG PO CAPS
20.0000 mg | ORAL_CAPSULE | Freq: Every day | ORAL | 2 refills | Status: DC
Start: 2017-05-23 — End: 2017-11-07

## 2017-07-18 DIAGNOSIS — Z01419 Encounter for gynecological examination (general) (routine) without abnormal findings: Secondary | ICD-10-CM | POA: Diagnosis not present

## 2017-07-18 DIAGNOSIS — Z1231 Encounter for screening mammogram for malignant neoplasm of breast: Secondary | ICD-10-CM | POA: Diagnosis not present

## 2017-07-18 DIAGNOSIS — Z681 Body mass index (BMI) 19 or less, adult: Secondary | ICD-10-CM | POA: Diagnosis not present

## 2017-11-07 ENCOUNTER — Other Ambulatory Visit: Payer: Self-pay | Admitting: Nurse Practitioner

## 2017-11-09 ENCOUNTER — Other Ambulatory Visit: Payer: Self-pay | Admitting: Family Medicine

## 2017-11-09 NOTE — Telephone Encounter (Signed)
May have this +1 refill needs follow-up office visit for this issue

## 2018-01-05 DIAGNOSIS — R0981 Nasal congestion: Secondary | ICD-10-CM | POA: Diagnosis not present

## 2018-01-05 DIAGNOSIS — R05 Cough: Secondary | ICD-10-CM | POA: Diagnosis not present

## 2018-01-05 DIAGNOSIS — J018 Other acute sinusitis: Secondary | ICD-10-CM | POA: Diagnosis not present

## 2018-01-05 DIAGNOSIS — B379 Candidiasis, unspecified: Secondary | ICD-10-CM | POA: Diagnosis not present

## 2018-03-02 ENCOUNTER — Other Ambulatory Visit: Payer: Self-pay | Admitting: Family Medicine

## 2018-03-07 NOTE — Telephone Encounter (Signed)
May have this refill needs office visit

## 2018-04-27 ENCOUNTER — Encounter: Payer: Self-pay | Admitting: Family Medicine

## 2018-04-27 MED ORDER — FLUOXETINE HCL 20 MG PO CAPS: 20.0000 mg | ORAL_CAPSULE | Freq: Every day | ORAL | 0 refills | Status: DC

## 2018-04-27 NOTE — Telephone Encounter (Signed)
Last check up 09/2016-Patient needs office visit scheduled then can send in enough till visit  Left message to return call

## 2018-04-27 NOTE — Telephone Encounter (Signed)
Patient scheduled office visit for med check. Prescription sent electronically to pharmacy(enough medication until office visit)

## 2018-05-02 ENCOUNTER — Ambulatory Visit: Payer: BLUE CROSS/BLUE SHIELD | Admitting: Family Medicine

## 2018-05-11 ENCOUNTER — Ambulatory Visit: Payer: BLUE CROSS/BLUE SHIELD | Admitting: Family Medicine

## 2018-05-18 ENCOUNTER — Ambulatory Visit: Payer: BLUE CROSS/BLUE SHIELD | Admitting: Family Medicine

## 2018-05-18 ENCOUNTER — Encounter: Payer: Self-pay | Admitting: Family Medicine

## 2018-05-18 VITALS — BP 118/80 | Ht 61.0 in | Wt 107.0 lb

## 2018-05-18 DIAGNOSIS — F419 Anxiety disorder, unspecified: Secondary | ICD-10-CM | POA: Diagnosis not present

## 2018-05-18 DIAGNOSIS — F5105 Insomnia due to other mental disorder: Secondary | ICD-10-CM

## 2018-05-18 DIAGNOSIS — F411 Generalized anxiety disorder: Secondary | ICD-10-CM

## 2018-05-18 MED ORDER — ALPRAZOLAM 0.5 MG PO TABS: 0.5000 mg | ORAL_TABLET | Freq: Every evening | ORAL | 0 refills | Status: DC | PRN

## 2018-05-18 MED ORDER — FLUOXETINE HCL 40 MG PO CAPS: 40.0000 mg | ORAL_CAPSULE | Freq: Every day | ORAL | 2 refills | Status: DC

## 2018-05-18 NOTE — Progress Notes (Signed)
   Subjective:    Patient ID: Floyce Stakes, female    DOB: 09/27/1975, 43 y.o.   MRN: [id number]  HPI   Med check. Needs refill on xanax. Only takes xanax when she has trouble sleeping - rare use.   Pt thinks prozac needs to be increased. Feels like anxiety is high, med has not been as helpful recently. Reports feeling more impatient than usual. Not sleeping as well - states waking up at night and just starts thinking about everything. Denies any new stressors. Reports has had to adjust medication dose in the past.   Hard to make time for herself. Trying to fit in some exercise. Going to try to start working out at lunch. States felt better when she used to workout.   Denies SI/HI.    Review of Systems  Constitutional: Negative for fever and unexpected weight change.  Respiratory: Negative for shortness of breath.   Cardiovascular: Negative for chest pain.  Gastrointestinal: Negative for abdominal pain.  Psychiatric/Behavioral: Positive for sleep disturbance. Negative for suicidal ideas. The patient is nervous/anxious.        Objective:   Physical Exam Vitals signs and nursing note reviewed.  Constitutional:      General: She is not in acute distress.    Appearance: She is well-developed.  HENT:     Head: Normocephalic and atraumatic.  Neck:     Musculoskeletal: Neck supple.  Cardiovascular:     Rate and Rhythm: Normal rate and regular rhythm.     Heart sounds: Normal heart sounds. No murmur.  Pulmonary:     Effort: Pulmonary effort is normal. No respiratory distress.     Breath sounds: Normal breath sounds.  Skin:    General: Skin is warm and dry.  Neurological:     Mental Status: She is alert and oriented to person, place, and time.  Psychiatric:        Mood and Affect: Mood normal.        Behavior: Behavior normal.        Thought Content: Thought content normal.           Assessment & Plan:  Generalized anxiety disorder  Insomnia secondary to  anxiety  Reviewed PHQ 9 and GAD 7 results with patient. Feel like she would benefit from increased dose of prozac, will increase to 40 mg daily. Discussed self-care measures and coping strategies. Pt with rare use of xanax prn for sleep, will refill, PMP database checked. Recommend f/u in 3 months to see how she is doing on new dose of prozac, she is to notify us if she has any problems with this. She understands if she is to develop SI/HI she should seek emergency care.

## 2018-05-24 DIAGNOSIS — Z319 Encounter for procreative management, unspecified: Secondary | ICD-10-CM | POA: Diagnosis not present

## 2018-05-29 ENCOUNTER — Encounter: Payer: Self-pay | Admitting: Family Medicine

## 2018-05-29 NOTE — Telephone Encounter (Signed)
Please let mom know that I looked at the picture I do not see any evidence of MRSA If it gets worse he needs to be seen  More than likely this appears to be ringworm-which is a form of skin fungus called tinea Please send in ketoconazole, 30 g to, 1 refill May have ketoconazole cream apply twice daily Typically this is applied for 2 weeks Keep covered at time of regionals If they would like to be seen this week we have other office visits available

## 2018-07-03 ENCOUNTER — Encounter: Payer: Self-pay | Admitting: Family Medicine

## 2018-07-04 NOTE — Telephone Encounter (Signed)
Information was sent to the patient no need for further handling thank you

## 2018-07-04 NOTE — Telephone Encounter (Signed)
Information was sent to the patient regarding this no need for further handling

## 2018-08-10 ENCOUNTER — Ambulatory Visit: Payer: BLUE CROSS/BLUE SHIELD | Admitting: Family Medicine

## 2018-08-21 ENCOUNTER — Encounter: Payer: Self-pay | Admitting: Family Medicine

## 2018-08-21 ENCOUNTER — Other Ambulatory Visit: Payer: Self-pay | Admitting: *Deleted

## 2018-08-21 MED ORDER — FLUOXETINE HCL 20 MG PO TABS: ORAL_TABLET | ORAL | 0 refills | Status: DC

## 2018-08-21 NOTE — Telephone Encounter (Signed)
Nurses Next dose Prozac 60 mg Prozac 20 mg, take 3 daily #90 with a virtual visit in 2 to 3 weeks to see how things are going with this dose call us sooner problems

## 2018-08-21 NOTE — Telephone Encounter (Signed)
This was changed to a phone message in [place] chart.

## 2018-09-06 ENCOUNTER — Telehealth: Payer: Self-pay | Admitting: Family Medicine

## 2018-09-06 MED ORDER — ALPRAZOLAM 0.5 MG PO TABS: 0.5000 mg | ORAL_TABLET | Freq: Every evening | ORAL | 2 refills | Status: DC | PRN

## 2018-09-06 NOTE — Telephone Encounter (Signed)
May have 30 with 2 refills

## 2018-09-06 NOTE — Addendum Note (Signed)
Addended by: Marlowe Shores on: 09/06/2018 03:59 PM   Modules accepted: Orders

## 2018-09-06 NOTE — Telephone Encounter (Signed)
Medication refill called into [facility] pharmacy

## 2018-09-06 NOTE — Telephone Encounter (Signed)
Pharmacy requesting refill on Alprazolam 0.5 mg tablet. Take one tablet by mouth at bedtime prn sleep. Last seen 05/18/2018 for GAD

## 2018-10-18 ENCOUNTER — Other Ambulatory Visit: Payer: Self-pay | Admitting: Family Medicine

## 2018-10-18 NOTE — Telephone Encounter (Signed)
9B, needs virtual visit later this summer

## 2018-12-19 ENCOUNTER — Encounter: Payer: Self-pay | Admitting: Family Medicine

## 2018-12-27 ENCOUNTER — Other Ambulatory Visit: Payer: Self-pay

## 2018-12-27 ENCOUNTER — Ambulatory Visit (INDEPENDENT_AMBULATORY_CARE_PROVIDER_SITE_OTHER): Payer: BC Managed Care – PPO | Admitting: Family Medicine

## 2018-12-27 DIAGNOSIS — F411 Generalized anxiety disorder: Secondary | ICD-10-CM

## 2018-12-27 DIAGNOSIS — F419 Anxiety disorder, unspecified: Secondary | ICD-10-CM

## 2018-12-27 DIAGNOSIS — F5105 Insomnia due to other mental disorder: Secondary | ICD-10-CM | POA: Diagnosis not present

## 2018-12-27 MED ORDER — ALPRAZOLAM 0.5 MG PO TABS: 0.5000 mg | ORAL_TABLET | Freq: Every evening | ORAL | 2 refills | Status: DC | PRN

## 2018-12-27 MED ORDER — FLUOXETINE HCL 20 MG PO CAPS: ORAL_CAPSULE | ORAL | 6 refills | Status: DC

## 2018-12-27 NOTE — Progress Notes (Signed)
   Subjective:    Patient ID: Tina Ramirez, female    DOB: Jan 04, 1976, 43 y.o.   MRN: [id number]  Depression        This is a chronic problem.  Treatments tried: prozac 20mg . phq9 done.  Pt states no concerns or problems today.  Patient has underlying generalized anxiety disorder with occasional issues of panic attack states lately has been under very good control denies being depressed states anxiety under good control with medicine she would like to continue the medicine not having any side effects with it she also relates that overall moods are doing good Occasionally has to use the Xanax to help her sleep at night but does not use it on a regular basis  Uses Valtrex occasionally for fever blisters  She does see her gynecologist on a regular basis for Pap smears and she has had 2 mammographies in the past Virtual Visit via Telephone Note  I connected with Tina Ramirez on 12/27/18 at  9:30 AM EDT by telephone and verified that I am speaking with the correct person using two identifiers.  Location: Patient: home Provider: office   I discussed the limitations, risks, security and privacy concerns of performing an evaluation and management service by telephone and the availability of in person appointments. I also discussed with the patient that there may be a patient responsible charge related to this service. The patient expressed understanding and agreed to proceed.   History of Present Illness:    Observations/Objective:   Assessment and Plan:   Follow Up Instructions:    I discussed the assessment and treatment plan with the patient. The patient was provided an opportunity to ask questions and all were answered. The patient agreed with the plan and demonstrated an understanding of the instructions.   The patient was advised to call back or seek an in-person evaluation if the symptoms worsen or if the condition fails to improve as anticipated.  I provided 15 minutes of  non-face-to-face time during this encounter.      Review of Systems  Psychiatric/Behavioral: Positive for depression.       Objective:   Physical Exam  Today's visit was via telephone Physical exam was not possible for this visit       Assessment & Plan:  We did discuss screening labs recommend those to be done next spring  Generalized anxiety disorder doing well with current medications patient would like to continue these refills were sent in Xanax put on file use sparingly follow-up if progressive troubles or any other problems otherwise see her back in the springtime  Patient will continue in general health measures with gynecology Flu shot this fall recommended

## 2018-12-27 NOTE — Progress Notes (Signed)
   Subjective:    Patient ID: Tina Ramirez, female    DOB: 01-17-1976, 43 y.o.   MRN: [id number]  HPI    Review of Systems     Objective:   Physical Exam        Assessment & Plan:

## 2019-02-28 DIAGNOSIS — Z20828 Contact with and (suspected) exposure to other viral communicable diseases: Secondary | ICD-10-CM | POA: Diagnosis not present

## 2019-03-02 ENCOUNTER — Other Ambulatory Visit: Payer: Self-pay | Admitting: *Deleted

## 2019-03-02 DIAGNOSIS — Z20822 Contact with and (suspected) exposure to covid-19: Secondary | ICD-10-CM

## 2019-03-04 LAB — NOVEL CORONAVIRUS, NAA: SARS-CoV-2, NAA: NOT DETECTED

## 2019-06-26 ENCOUNTER — Other Ambulatory Visit: Payer: Self-pay | Admitting: General Surgery

## 2019-06-28 LAB — SURGICAL PATHOLOGY

## 2019-07-02 ENCOUNTER — Other Ambulatory Visit: Payer: Self-pay

## 2019-07-02 ENCOUNTER — Emergency Department (HOSPITAL_COMMUNITY)
Admission: EM | Admit: 2019-07-02 | Discharge: 2019-07-02 | Disposition: A | Discharge status: Home / Self Care | Payer: 59 | Attending: Emergency Medicine | Admitting: Emergency Medicine

## 2019-07-02 ENCOUNTER — Encounter: Payer: Self-pay | Admitting: Family Medicine

## 2019-07-02 ENCOUNTER — Encounter (HOSPITAL_COMMUNITY): Payer: Self-pay

## 2019-07-02 DIAGNOSIS — T8149XA Infection following a procedure, other surgical site, initial encounter: Secondary | ICD-10-CM | POA: Insufficient documentation

## 2019-07-02 DIAGNOSIS — Y69 Unspecified misadventure during surgical and medical care: Secondary | ICD-10-CM | POA: Insufficient documentation

## 2019-07-02 DIAGNOSIS — Z79899 Other long term (current) drug therapy: Secondary | ICD-10-CM | POA: Diagnosis not present

## 2019-07-02 DIAGNOSIS — Z4801 Encounter for change or removal of surgical wound dressing: Secondary | ICD-10-CM | POA: Diagnosis not present

## 2019-07-02 MED ORDER — DOXYCYCLINE HYCLATE 100 MG PO CAPS: 100.0000 mg | ORAL_CAPSULE | Freq: Two times a day (BID) | ORAL | 0 refills | Status: DC

## 2019-07-02 MED ORDER — DOXYCYCLINE HYCLATE 100 MG PO TABS
100.0000 mg | ORAL_TABLET | Freq: Once | ORAL | Status: AC
  Administered 2019-07-02: 100 mg via ORAL
  Filled 2019-07-02: qty 1

## 2019-07-02 MED ORDER — NAPROXEN 500 MG PO TABS: 500.0000 mg | ORAL_TABLET | Freq: Two times a day (BID) | ORAL | 0 refills | Status: DC

## 2019-07-02 MED ORDER — KETOROLAC TROMETHAMINE 60 MG/2ML IM SOLN
60.0000 mg | Freq: Once | INTRAMUSCULAR | Status: AC
  Administered 2019-07-02: 60 mg via INTRAMUSCULAR
  Filled 2019-07-02: qty 2

## 2019-07-02 NOTE — Discharge Instructions (Signed)
Your exam is consistent with cellulitis - see the attached instructions,  Return for worsening swelling, pain or fever See your surgeon in follow up in next 3 days if no better Doxycycline 100mg  by mouth twice daily until the medicine is completely finished - this medicine is a strong antibiotic that treats certain infections.   Please take Naprosyn, 500mg  by mouth twice daily as needed for pain - this in an antiinflammatory medicine (NSAID) and is similar to ibuprofen - many people feel that it is stronger than ibuprofen and it is easier to take since it is a smaller pill.  Please use this only for 1 week - if your pain persists, you will need to follow up with your doctor in the office for ongoing guidance and pain control.

## 2019-07-02 NOTE — ED Triage Notes (Signed)
Pt presents to ED to check place on right leg. Pt had basal cell skin cancer removed on right leg last Tuesday. Pt states she has had swelling and drainage since. Sutures noted to be present.

## 2019-07-02 NOTE — ED Provider Notes (Signed)
[facility] EMERGENCY DEPARTMENT Provider Note   CSN: [id number] Arrival date & time: 07/02/19  2022     History Chief Complaint  Patient presents with  . Wound Check    Tina Ramirez is a 44 y.o. female.  HPI   This patient is a 44 year old female, she has a history of a basal cell carcinoma which had been removed approximately 6 days ago, over the last couple of days she has had some increasing redness and swelling with a small amount of purulent drainage from the sutured wound.  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  She has not treated this with any specific medications but has been taking Tylenol and ibuprofen for pain which are not helping very much  Past Medical History:  Diagnosis Date  . AMA (advanced maternal age) multigravida 35+   . Celiac disease    pt says was a misdiagnosis  . Depression   . HSV (herpes simplex virus) anogenital infection   . IBS (irritable bowel syndrome)   . Vaginal Pap smear, abnormal     Patient Active Problem List   Diagnosis Date Noted  . Cesarean delivery delivered 02/03/2016  . Generalized anxiety disorder 08/02/2012  . 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 Nausea and vomiting 08/26/2011    Past Surgical History:  Procedure Laterality Date  . ADENOIDECTOMY    . CESAREAN SECTION     x 2  . CESAREAN SECTION N/A 02/03/2016   Procedure: CESAREAN SECTION;  Surgeon: Candice Camp, MD;  Location: [facility] BIRTHING SUITES;  Service: Obstetrics;  Laterality: N/A;  Repeat edc 02/09/16 NKDA Needs RNFA  . cold knife conization  2007  . ESOPHAGOGASTRODUODENOSCOPY  10/06/2011   Procedure: ESOPHAGOGASTRODUODENOSCOPY (EGD);  Surgeon: Malissa Hippo, MD;  [facility];  Service: Endoscopy;  Laterality: N/A;  310  . HAND SURGERY     right x 2   . MOUTH SURGERY       OB History    Gravida  4   Para  4   Term  1   Preterm  3   AB      Living  2     SAB      TAB      Ectopic      Multiple  0   Live Births  2           Family History  Problem Relation Age of Onset  . Asthma Son   . Transient ischemic attack Mother     Social History   Tobacco Use  . Smoking status: Never Smoker  . Smokeless tobacco: Never Used  Substance Use Topics  . Alcohol use: Yes    Comment: occasional  . Drug use: No    Home Medications Prior to Admission medications   Medication Sig Start Date End Date Taking? Authorizing Provider  ALPRAZolam Prudy Feeler) 0.5 MG tablet Take 1 tablet (0.5 mg total) by mouth at bedtime as needed for sleep. 12/27/18   Babs Sciara, MD  doxycycline (VIBRAMYCIN) 100 MG capsule Take 1 capsule (100 mg total) by mouth 2 (two) times daily. 07/02/19   Eber Hong, MD  FLUoxetine (PROZAC) 20 MG capsule TAKE 3 CAPSULES BY MOUTH ONCE A DAY. Needs visit with Dr. Lorin Picket 12/27/18   Babs Sciara, MD  [facility] 0.25-35 MG-MCG tablet  06/15/16  [provider]  naproxen (NAPROSYN) 500 MG tablet Take 1 tablet (500 mg total) by mouth 2 (two) times daily with a meal. 07/02/19   Noemi Chapel, MD  Prenatal Vit-Fe Fumarate-FA (PRENATAL MULTIVITAMIN) TABS tablet Take 1 tablet by mouth daily at 12 noon.    [provider]  valACYclovir (VALTREX) 500 MG tablet TAKE ONE (1) TABLET EACH DAY 11/09/17   Kathyrn Drown, MD    Allergies    Patient has no known allergies.  Review of Systems   Review of Systems  Constitutional: Negative for fever.  Skin: Positive for rash.    Physical Exam Updated Vital Signs BP (!) 143/88 (BP Location: Right Arm)   Pulse 90   Temp 98.6 F (37 C) (Oral)   Ht 1.549 m (5\' 1" )   Wt 52.2 kg   LMP 07/02/2019   SpO2 100%   BMI 21.73 kg/m   Physical Exam Vitals and nursing note reviewed.  Constitutional:      Appearance: She is well-developed. She is not diaphoretic.  HENT:     Head: Normocephalic and atraumatic.   Eyes:     General:        Right eye: No discharge.        Left eye: No discharge.     Conjunctiva/sclera: Conjunctivae normal.  Pulmonary:     Effort: Pulmonary effort is normal. No respiratory distress.  Skin:    General: Skin is warm and dry.     Findings: Erythema and rash present.     Comments: There is redness and warmth of the skin surrounding the right lower extremity sutured wound which is just distal to the knee on the anterolateral aspect of the leg.  There is no induration, there is no peau d'orange, there is no purulent drainage expressed from the sutured wound.  This redness extends down towards the ankle, it does not go ascend onto the knee  Neurological:     Mental Status: She is alert.     Coordination: Coordination normal.     Comments: Normal speech and gait     ED Results / Procedures / Treatments   Labs (all labs ordered are listed, but only abnormal results are displayed) Labs Reviewed - No data to display  EKG None  Radiology No results found.  Procedures Procedures (including critical care time)  Medications Ordered in ED Medications  ketorolac (TORADOL) injection 60 mg (has no administration in time range)  doxycycline (VIBRA-TABS) tablet 100 mg (has no administration in time range)    ED Course  I have reviewed the triage vital signs and the nursing notes.  Pertinent labs & imaging results that were available during my care of the patient were reviewed by me and considered in my medical decision making (see chart for details).    MDM Rules/Calculators/A&P                      This patient has a supple knee joint, I suspect she has a cellulitis surrounding an infected wound.  Will start on doxycycline, she was given the indications for return and is agreeable.  She states her pain is 10 out of 10 however there is no crepitance or subcutaneous emphysema, she appears well and is not tachycardic or febrile  She is agreeable to the plan.  I did  outline this wound and all of the redness with a permanent marker, I took a picture with her phone so that she can share  it with her doctor  Final Clinical Impression(s) / ED Diagnoses Final diagnoses:  Cellulitis, wound, post-operative    Rx / DC Orders ED Discharge Orders         Ordered    doxycycline (VIBRAMYCIN) 100 MG capsule  2 times daily     07/02/19 2037    naproxen (NAPROSYN) 500 MG tablet  2 times daily with meals     07/02/19 2037           Eber Hong, MD 07/02/19 2039

## 2019-07-02 NOTE — Telephone Encounter (Signed)
Left a message that I would call her back at 8:30 tomorrow since I could not reach her before we left the office today.

## 2019-07-02 NOTE — ED Notes (Signed)
Dr Miller in triage to see pt.  

## 2019-07-02 NOTE — Telephone Encounter (Signed)
Nurses I am concerned she may be developing some cellulitis I would recommend a visit on Tuesday please

## 2019-07-03 NOTE — Telephone Encounter (Signed)
Called pt this morning to get her an appt this morning ( I could not get in touch with her yesterday evening) she states it got worse last night so she went to ED and was told it was cellulitis. Pt states she will follow up with dr Lorin Picket if not getting better or if worse.

## 2019-07-26 ENCOUNTER — Encounter: Payer: Self-pay | Admitting: Family Medicine

## 2019-08-12 ENCOUNTER — Encounter: Payer: Self-pay | Admitting: Family Medicine

## 2019-08-13 ENCOUNTER — Other Ambulatory Visit: Payer: Self-pay | Admitting: *Deleted

## 2019-08-13 MED ORDER — FLUCONAZOLE 150 MG PO TABS: ORAL_TABLET | ORAL | 0 refills | Status: DC

## 2019-08-22 ENCOUNTER — Encounter: Payer: Self-pay | Admitting: Family Medicine

## 2019-08-22 NOTE — Telephone Encounter (Signed)
Recommend virtual visit or in person on Thursday  Also is dog UTD on rabies shot? Their dog or someone elses?

## 2019-08-29 ENCOUNTER — Other Ambulatory Visit: Payer: Self-pay | Admitting: Family Medicine

## 2019-08-29 NOTE — Telephone Encounter (Signed)
Last seen for this med 12/27/18 

## 2019-09-05 ENCOUNTER — Encounter: Payer: Self-pay | Admitting: Emergency Medicine

## 2019-09-05 ENCOUNTER — Ambulatory Visit
Admission: EM | Admit: 2019-09-05 | Discharge: 2019-09-05 | Disposition: A | Discharge status: Home / Self Care | Payer: Managed Care, Other (non HMO) | Attending: Physician Assistant | Admitting: Physician Assistant

## 2019-09-05 ENCOUNTER — Other Ambulatory Visit: Payer: Self-pay

## 2019-09-05 DIAGNOSIS — R310 Gross hematuria: Secondary | ICD-10-CM | POA: Diagnosis not present

## 2019-09-05 DIAGNOSIS — R399 Unspecified symptoms and signs involving the genitourinary system: Secondary | ICD-10-CM | POA: Diagnosis present

## 2019-09-05 LAB — POCT URINALYSIS DIP (MANUAL ENTRY)
Bilirubin, UA: NEGATIVE
Glucose, UA: NEGATIVE mg/dL
Ketones, POC UA: NEGATIVE mg/dL
Nitrite, UA: POSITIVE — AB
Protein Ur, POC: >=300 mg/dL — AB
Spec Grav, UA: >=1.03 — AB (ref 1.010–1.025)
Urobilinogen, UA: 0.2 E.U./dL
pH, UA: 6 (ref 5.0–8.0)

## 2019-09-05 LAB — POCT URINE PREGNANCY: Preg Test, Ur: NEGATIVE

## 2019-09-05 MED ORDER — CEPHALEXIN 500 MG PO CAPS: 500.0000 mg | ORAL_CAPSULE | Freq: Two times a day (BID) | ORAL | 0 refills | Status: DC

## 2019-09-05 MED ORDER — FLUCONAZOLE 150 MG PO TABS: 150.0000 mg | ORAL_TABLET | Freq: Every day | ORAL | 0 refills | Status: DC

## 2019-09-05 NOTE — ED Provider Notes (Signed)
[facility]    CSN: [id number] Arrival date & time: 09/05/19  0849      History   Chief Complaint Chief Complaint  Patient presents with  . Urinary Tract Infection    HPI Tina Ramirez is a 44 y.o. female.   44 year old female comes in for 2 day history of urinary symptoms. Has had urinary frequency, hematuria, hesitancy. Has had low abdominal pressure with urination. Denies nausea, vomiting. Denies fever, chills, flank/back pain. Denies vaginal discharge, itching, spotting. LMP 08/22/2019. Took AZO for symptoms. Denies history of kidney stones.      Past Medical History:  Diagnosis Date  . AMA (advanced maternal age) multigravida 58+   . Celiac disease    pt says was a misdiagnosis  . Depression   . HSV (herpes simplex virus) anogenital infection   . IBS (irritable bowel syndrome)   . Vaginal Pap smear, abnormal     Patient Active Problem List   Diagnosis Date Noted  . Cesarean delivery delivered 02/03/2016  . Generalized anxiety disorder 08/02/2012  . 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 Nausea and vomiting 08/26/2011    Past Surgical History:  Procedure Laterality Date  . ADENOIDECTOMY    . CESAREAN SECTION     x 2  . CESAREAN SECTION N/A 02/03/2016   Procedure: CESAREAN SECTION;  Surgeon: Louretta Shorten, MD;  Location: [place];  Service: Obstetrics;  Laterality: N/A;  Repeat edc 02/09/16 NKDA Needs RNFA  . cold knife conization  2007  . ESOPHAGOGASTRODUODENOSCOPY  10/06/2011   Procedure: ESOPHAGOGASTRODUODENOSCOPY (EGD);  Surgeon: Rogene Houston, MD;  [facility];  Service: Endoscopy;  Laterality: N/A;  310  . HAND SURGERY     right x 2   . MOUTH SURGERY      OB History    Gravida  4   Para  4   Term  1   Preterm  3   AB      Living  2     SAB      TAB      Ectopic      Multiple  0   Live Births  2            Home Medications    Prior to Admission medications   Medication Sig Start  Date End Date Taking? Authorizing Provider  FLUoxetine (PROZAC) 20 MG capsule TAKE 3 CAPSULES BY MOUTH ONCE A DAY. Needs visit with Dr. Nicki Reaper 12/27/18  Yes Wolfgang Phoenix, Elayne Snare, MD  ALPRAZolam Duanne Moron) 0.5 MG tablet Take 1 tablet (0.5 mg total) by mouth at bedtime as needed for sleep. 12/27/18   Kathyrn Drown, MD  cephALEXin (KEFLEX) 500 MG capsule Take 1 capsule (500 mg total) by mouth 2 (two) times daily. 09/05/19   Tasia Catchings, Makhai Fulco V, PA-C  fluconazole (DIFLUCAN) 150 MG tablet Take 1 tablet (150 mg total) by mouth daily. Take second dose 72 hours later if symptoms still persists. 09/05/19   Tasia Catchings, Zareen Jamison V, PA-C  valACYclovir (VALTREX) 500 MG tablet TAKE ONE (1) TABLET BY MOUTH EVERY DAY 08/29/19   Erven Colla, DO    Family History Family History  Problem Relation Age of Onset  . Asthma Son   . Transient ischemic attack Mother     Social History Social History   Tobacco Use  . Smoking status: Never Smoker  . Smokeless tobacco: Never Used  Substance Use Topics  . Alcohol use: Yes  Comment: occasional  . Drug use: No     Allergies   Patient has no known allergies.   Review of Systems Review of Systems  Reason unable to perform ROS: See HPI as above.     Physical Exam Triage Vital Signs ED Triage Vitals  Enc Vitals Group     BP 09/05/19 0901 115/81     Pulse Rate 09/05/19 0901 87     Resp 09/05/19 0901 16     Temp 09/05/19 0901 98.7 F (37.1 C)     Temp Source 09/05/19 0901 Oral     SpO2 09/05/19 0901 98 %     Weight --      Height --      Head Circumference --      Peak Flow --      Pain Score 09/05/19 0857 4     Pain Loc --      Pain Edu? --      Excl. in GC? --    No data found.  Updated Vital Signs BP 115/81 (BP Location: Left Arm)   Pulse 87   Temp 98.7 F (37.1 C) (Oral)   Resp 16   LMP 08/22/2019   SpO2 98%   Physical Exam Constitutional:      General: She is not in acute distress.    Appearance: She is well-developed. She is not ill-appearing,  toxic-appearing or diaphoretic.  HENT:     Head: Normocephalic and atraumatic.  Eyes:     Conjunctiva/sclera: Conjunctivae normal.     Pupils: Pupils are equal, round, and reactive to light.  Cardiovascular:     Rate and Rhythm: Normal rate and regular rhythm.  Pulmonary:     Effort: Pulmonary effort is normal. No respiratory distress.     Comments: LCTAB Abdominal:     General: Bowel sounds are normal.     Palpations: Abdomen is soft.     Tenderness: There is no abdominal tenderness. There is no right CVA tenderness, left CVA tenderness, guarding or rebound.  Musculoskeletal:     Cervical back: Normal range of motion and neck supple.  Skin:    General: Skin is warm and dry.  Neurological:     Mental Status: She is alert and oriented to person, place, and time.  Psychiatric:        Behavior: Behavior normal.        Judgment: Judgment normal.      UC Treatments / Results  Labs (all labs ordered are listed, but only abnormal results are displayed) Labs Reviewed  POCT URINALYSIS DIP (MANUAL ENTRY) - Abnormal; Notable for the following components:      Result Value   Clarity, UA cloudy (*)    Spec Grav, UA >=1.030 (*)    Blood, UA large (*)    Protein Ur, POC >=300 (*)    Nitrite, UA Positive (*)    Leukocytes, UA Large (3+) (*)    All other components within normal limits  URINE CULTURE  POCT URINE PREGNANCY    EKG   Radiology No results found.  Procedures Procedures (including critical care time)  Medications Ordered in UC Medications - No data to display  Initial Impression / Assessment and Plan / UC Course  I have reviewed the triage vital signs and the nursing notes.  Pertinent labs & imaging results that were available during my care of the patient were reviewed by me and considered in my medical decision making (see chart for details).  Discussed given AZO use, dipstick results can be falsely positive. However, given significant urinary symptoms,  will cover for UTI with keflex and send urine for culture. Discussed cannot rule out urethral stone causing symptoms, patient currently afebrile, negative CVA tenderness, will continue to monitor. Push fluids. Return precautions given. Patient expresses understanding and agrees to plan.  Final Clinical Impressions(s) / UC Diagnoses   Final diagnoses:  Gross hematuria  Urinary tract infection symptoms    ED Prescriptions    Medication Sig Dispense Auth. Provider   cephALEXin (KEFLEX) 500 MG capsule Take 1 capsule (500 mg total) by mouth 2 (two) times daily. 10 capsule Marlan Steward V, PA-C   fluconazole (DIFLUCAN) 150 MG tablet Take 1 tablet (150 mg total) by mouth daily. Take second dose 72 hours later if symptoms still persists. 2 tablet Belinda Fisher, PA-C     PDMP not reviewed this encounter.   Belinda Fisher, PA-C 09/05/19 [phone]

## 2019-09-05 NOTE — Discharge Instructions (Signed)
As discussed, AZO use may have had false positive urine, however, given symptoms, will treat for UTI. Start keflex as directed. Keep hydrated, urine should be clear to pale yellow in color. Monitor for any worsening of symptoms, fever, worsening abdominal pain, nausea/vomiting, flank pain, follow up for reevaluation.

## 2019-09-05 NOTE — ED Triage Notes (Signed)
History of uti.  Symptoms started yesterday.  Reports frequent urination, reports small amount of urine, blood in urine and lower abdominal pain.  Abdominal pain is pressure feeling

## 2019-09-07 LAB — URINE CULTURE: Culture: >=100000 — AB

## 2019-09-14 ENCOUNTER — Other Ambulatory Visit: Payer: Self-pay

## 2019-09-14 ENCOUNTER — Telehealth: Payer: Self-pay | Admitting: Emergency Medicine

## 2019-09-14 ENCOUNTER — Ambulatory Visit
Admission: EM | Admit: 2019-09-14 | Discharge: 2019-09-14 | Disposition: A | Discharge status: Home / Self Care | Payer: Managed Care, Other (non HMO) | Attending: Physician Assistant | Admitting: Physician Assistant

## 2019-09-14 ENCOUNTER — Encounter: Payer: Self-pay | Admitting: Emergency Medicine

## 2019-09-14 DIAGNOSIS — R399 Unspecified symptoms and signs involving the genitourinary system: Secondary | ICD-10-CM

## 2019-09-14 LAB — POCT URINALYSIS DIP (MANUAL ENTRY)
Bilirubin, UA: NEGATIVE
Blood, UA: NEGATIVE
Glucose, UA: NEGATIVE mg/dL
Ketones, POC UA: NEGATIVE mg/dL
Leukocytes, UA: NEGATIVE
Nitrite, UA: NEGATIVE
Protein Ur, POC: NEGATIVE mg/dL
Spec Grav, UA: 1.025 (ref 1.010–1.025)
Urobilinogen, UA: 0.2 E.U./dL
pH, UA: 6 (ref 5.0–8.0)

## 2019-09-14 NOTE — ED Provider Notes (Signed)
[facility]    CSN: [id number] Arrival date & time: 09/14/19  0847      History   Chief Complaint Chief Complaint  Patient presents with  . Dysuria    HPI Tina Ramirez is a 44 y.o. female.   44 year old female returns to clinic for urinary symptoms after being seen 09/05/2019. Had cystitis with urine culture growing e coli. Was on keflex, which urine culture showed susceptibility. Symptoms completely resolved after current symptom onset. States now with urinary urgency/hesitancy. No dysuria, hematuria. No abdominal pain, fever, vaginal symptoms. Drinking more pepsi.      Past Medical History:  Diagnosis Date  . AMA (advanced maternal age) multigravida 17+   . Celiac disease    pt says was a misdiagnosis  . Depression   . HSV (herpes simplex virus) anogenital infection   . IBS (irritable bowel syndrome)   . Vaginal Pap smear, abnormal     Patient Active Problem List   Diagnosis Date Noted  . Cesarean delivery delivered 02/03/2016  . Generalized anxiety disorder 08/02/2012  . 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 Nausea and vomiting 08/26/2011    Past Surgical History:  Procedure Laterality Date  . ADENOIDECTOMY    . CESAREAN SECTION     x 2  . CESAREAN SECTION N/A 02/03/2016   Procedure: CESAREAN SECTION;  Surgeon: Louretta Shorten, MD;  Location: [place];  Service: Obstetrics;  Laterality: N/A;  Repeat edc 02/09/16 NKDA Needs RNFA  . cold knife conization  2007  . ESOPHAGOGASTRODUODENOSCOPY  10/06/2011   Procedure: ESOPHAGOGASTRODUODENOSCOPY (EGD);  Surgeon: Rogene Houston, MD;  [facility];  Service: Endoscopy;  Laterality: N/A;  310  . HAND SURGERY     right x 2   . MOUTH SURGERY      OB History    Gravida  4   Para  4   Term  1   Preterm  3   AB      Living  2     SAB      TAB      Ectopic      Multiple  0   Live Births  2            Home Medications    Prior to Admission medications     Medication Sig Start Date End Date Taking? Authorizing Provider  ALPRAZolam Duanne Moron) 0.5 MG tablet Take 1 tablet (0.5 mg total) by mouth at bedtime as needed for sleep. 12/27/18   Kathyrn Drown, MD  FLUoxetine (PROZAC) 20 MG capsule TAKE 3 CAPSULES BY MOUTH ONCE A DAY. Needs visit with Dr. Nicki Reaper 12/27/18   Kathyrn Drown, MD  valACYclovir (VALTREX) 500 MG tablet TAKE ONE (1) TABLET BY MOUTH EVERY DAY 08/29/19   Erven Colla, DO    Family History Family History  Problem Relation Age of Onset  . Asthma Son   . Transient ischemic attack Mother     Social History Social History   Tobacco Use  . Smoking status: Never Smoker  . Smokeless tobacco: Never Used  Substance Use Topics  . Alcohol use: Yes    Comment: occasional  . Drug use: No     Allergies   Patient has no known allergies.   Review of Systems Review of Systems  Reason unable to perform ROS: See HPI as above.     Physical Exam Triage Vital Signs ED Triage Vitals [09/14/19 0858]  Enc Vitals  Group     BP 119/78     Pulse Rate 91     Resp 18     Temp 98.1 F (36.7 C)     Temp Source Oral     SpO2 98 %     Weight      Height      Head Circumference      Peak Flow      Pain Score 4     Pain Loc      Pain Edu?      Excl. in GC?    No data found.  Updated Vital Signs BP 119/78 (BP Location: Left Arm)   Pulse 91   Temp 98.1 F (36.7 C) (Oral)   Resp 18   LMP 08/22/2019   SpO2 98%   Visual Acuity Right Eye Distance:   Left Eye Distance:   Bilateral Distance:    Right Eye Near:   Left Eye Near:    Bilateral Near:     Physical Exam Constitutional:      General: She is not in acute distress.    Appearance: Normal appearance. She is well-developed. She is not toxic-appearing or diaphoretic.  HENT:     Head: Normocephalic and atraumatic.  Eyes:     Conjunctiva/sclera: Conjunctivae normal.     Pupils: Pupils are equal, round, and reactive to light.  Pulmonary:     Effort: Pulmonary effort  is normal. No respiratory distress.     Comments: Speaking in full sentences without difficulty Musculoskeletal:     Cervical back: Normal range of motion and neck supple.  Skin:    General: Skin is warm and dry.  Neurological:     Mental Status: She is alert and oriented to person, place, and time.      UC Treatments / Results  Labs (all labs ordered are listed, but only abnormal results are displayed) Labs Reviewed  POCT URINALYSIS DIP (MANUAL ENTRY)    EKG   Radiology No results found.  Procedures Procedures (including critical care time)  Medications Ordered in UC Medications - No data to display  Initial Impression / Assessment and Plan / UC Course  I have reviewed the triage vital signs and the nursing notes.  Pertinent labs & imaging results that were available during my care of the patient were reviewed by me and considered in my medical decision making (see chart for details).    Dipstick negative. Decrease caffeine intake. Follow up with PCP if symptoms does not resolve for further workup.  Final Clinical Impressions(s) / UC Diagnoses   Final diagnoses:  Urinary tract infection symptoms    ED Prescriptions    None     PDMP not reviewed this encounter.   Belinda Fisher, PA-C 09/14/19 [phone]

## 2019-09-14 NOTE — Discharge Instructions (Signed)
No infection. Decrease caffeine, keep hydrated with water. If symptoms not improving, follow up with PCP/urology for further evaluation.

## 2019-09-14 NOTE — Telephone Encounter (Signed)
Pt called sts still having urinary frequency after completing antibiotics; per AY pt to come in for recheck of urine

## 2019-09-14 NOTE — ED Triage Notes (Signed)
Pt recently treated for UTI that resolved but now having returning sx with dysuria and urgencyx 2 days

## 2019-10-04 ENCOUNTER — Ambulatory Visit
Admission: EM | Admit: 2019-10-04 | Discharge: 2019-10-04 | Disposition: A | Discharge status: Home / Self Care | Payer: Managed Care, Other (non HMO) | Attending: Family Medicine | Admitting: Family Medicine

## 2019-10-04 ENCOUNTER — Other Ambulatory Visit: Payer: Self-pay

## 2019-10-04 DIAGNOSIS — R509 Fever, unspecified: Secondary | ICD-10-CM

## 2019-10-04 DIAGNOSIS — J069 Acute upper respiratory infection, unspecified: Secondary | ICD-10-CM | POA: Diagnosis not present

## 2019-10-04 DIAGNOSIS — R0981 Nasal congestion: Secondary | ICD-10-CM

## 2019-10-04 DIAGNOSIS — R519 Headache, unspecified: Secondary | ICD-10-CM | POA: Diagnosis not present

## 2019-10-04 MED ORDER — FLUCONAZOLE 150 MG PO TABS
ORAL_TABLET | ORAL | 0 refills | Status: DC
Start: 2019-10-04 — End: 2020-03-20

## 2019-10-04 MED ORDER — AZITHROMYCIN 250 MG PO TABS
ORAL_TABLET | ORAL | 0 refills | Status: DC
Start: 2019-10-04 — End: 2020-03-20

## 2019-10-04 MED ORDER — CETIRIZINE-PSEUDOEPHEDRINE ER 5-120 MG PO TB12
1.0000 | ORAL_TABLET | Freq: Every day | ORAL | 0 refills | Status: DC
Start: 2019-10-04 — End: 2020-03-20

## 2019-10-04 NOTE — ED Triage Notes (Signed)
Provider triage  

## 2019-10-04 NOTE — ED Provider Notes (Signed)
[facility]   [id number] 10/04/19 Arrival Time: 0825   CC: COVID symptoms  SUBJECTIVE: History from: patient.  Tina Ramirez is a 44 y.o. female who presents with abrupt onset of nasal congestion, PND, fever and persistent dry cough for the last 3 days. Reports sick exposure to COVID and URI. Denies recent travel.  Has tried mucinex and advil cold and sinus with temporary relief. There are no aggravating symptoms. Denies previous symptoms in the past.   Denies fever, chills, fatigue, sinus pain, rhinorrhea, sore throat, SOB, wheezing, chest pain, nausea, changes in bowel or bladder habits.    ROS: As per HPI.  All other pertinent ROS negative.     Past Medical History:  Diagnosis Date  . AMA (advanced maternal age) multigravida 35+   . Celiac disease    pt says was a misdiagnosis  . Depression   . HSV (herpes simplex virus) anogenital infection   . IBS (irritable bowel syndrome)   . Vaginal Pap smear, abnormal    Past Surgical History:  Procedure Laterality Date  . ADENOIDECTOMY    . CESAREAN SECTION     x 2  . CESAREAN SECTION N/A 02/03/2016   Procedure: CESAREAN SECTION;  Surgeon: Candice Camp, MD;  Location: [facility] BIRTHING SUITES;  Service: Obstetrics;  Laterality: N/A;  Repeat edc 02/09/16 NKDA Needs RNFA  . cold knife conization  2007  . ESOPHAGOGASTRODUODENOSCOPY  10/06/2011   Procedure: ESOPHAGOGASTRODUODENOSCOPY (EGD);  Surgeon: Malissa Hippo, MD;  Location: [facility];  Service: Endoscopy;  Laterality: N/A;  310  . HAND SURGERY     right x 2   . MOUTH SURGERY     No Known Allergies No current facility-administered medications on file prior to encounter.   Current Outpatient Medications on File Prior to Encounter  Medication Sig Dispense Refill  . ALPRAZolam (XANAX) 0.5 MG tablet Take 1 tablet (0.5 mg total) by mouth at bedtime as needed for sleep. 30 tablet 2  . FLUoxetine (PROZAC) 20 MG capsule TAKE 3 CAPSULES BY MOUTH ONCE A DAY. Needs visit with  Dr. Lorin Picket 90 capsule 6  . valACYclovir (VALTREX) 500 MG tablet TAKE ONE (1) TABLET BY MOUTH EVERY DAY 30 tablet 0   Social History   Socioeconomic History  . Marital status: Married    Spouse name: Not on file  . Number of children: Not on file  . Years of education: Not on file  . Highest education level: Not on file  Occupational History  . Not on file  Tobacco Use  . Smoking status: Never Smoker  . Smokeless tobacco: Never Used  Substance and Sexual Activity  . Alcohol use: Yes    Comment: occasional  . Drug use: No  . Sexual activity: Never    Birth control/protection: None  Other Topics Concern  . Not on file  Social History Narrative  . Not on file   Social Determinants of Health   Financial Resource Strain:   . Difficulty of Paying Living Expenses:   Food Insecurity:   . Worried About Programme researcher, broadcasting/film/video in the Last Year:   . Barista in the Last Year:   Transportation Needs:   . Freight forwarder (Medical):   Marland Kitchen Lack of Transportation (Non-Medical):   Physical Activity:   . Days of Exercise per Week:   . Minutes of Exercise per Session:   Stress:   . Feeling of Stress :   Social Connections:   .  Frequency of Communication with Friends and Family:   . Frequency of Social Gatherings with Friends and Family:   . Attends Religious Services:   . Active Member of Clubs or Organizations:   . Attends Banker Meetings:   Marland Kitchen Marital Status:   Intimate Partner Violence:   . Fear of Current or Ex-Partner:   . Emotionally Abused:   Marland Kitchen Physically Abused:   . Sexually Abused:    Family History  Problem Relation Age of Onset  . Asthma Son   . Transient ischemic attack Mother     OBJECTIVE:  Vitals:   10/04/19 0832  BP: (!) 131/93  Pulse: 87  Resp: 17  Temp: 98.2 F (36.8 C)  TempSrc: Oral  SpO2: 98%     General appearance: alert; appears fatigued, but nontoxic; speaking in full sentences and tolerating own secretions HEENT:  NCAT; Ears: EACs clear, TMs pearly gray; Eyes: PERRL.  EOM grossly intact. Sinuses: nontender; Nose: nares patent without rhinorrhea, Throat: oropharynx clear, tonsils non erythematous or enlarged, uvula midline  Neck: supple without LAD Lungs: unlabored respirations, symmetrical air entry; cough: moderate; no respiratory distress; CTAB Heart: regular rate and rhythm. Radial pulses 2+ symmetrical bilaterally Skin: warm and dry Psychological: alert and cooperative; normal mood and affect  LABS:  No results found for this or any previous visit (from the past 24 hour(s)).   ASSESSMENT & PLAN:  1. Upper respiratory tract infection, unspecified type   2. Nasal congestion   3. Nonintractable headache, unspecified chronicity pattern, unspecified headache type   4. Fever, unspecified fever cause     Meds ordered this encounter  Medications  . azithromycin (ZITHROMAX Z-PAK) 250 MG tablet    Sig: Take 2 tablets today, and 1 tablet for the next 4 days.    Dispense:  6 tablet    Refill:  0    Order Specific Question:   Supervising Provider    Answer:   Merrilee Jansky [id number]  . cetirizine-pseudoephedrine (ZYRTEC-D) 5-120 MG tablet    Sig: Take 1 tablet by mouth daily.    Dispense:  30 tablet    Refill:  0    Order Specific Question:   Supervising Provider    Answer:   Merrilee Jansky [id number]  . fluconazole (DIFLUCAN) 150 MG tablet    Sig: Take one tablet at the onset of symptoms, if still having symptoms in 3 days, take the second tablet.    Dispense:  2 tablet    Refill:  0    Order Specific Question:   Supervising Provider    Answer:   Merrilee Jansky [[id number]]    URI Headache Fever Nasal Congestion  Prescribed zyrtec D Prescribed Azithromycin Prescribed Fluconazole in case of yeast infection Take medications as prescribed and to completion  COVID testing ordered.  It will take between 1-2 days for test results.  Someone will contact you regarding abnormal results.     Patient should remain in quarantine until they have received Covid results.  If negative you may resume normal activities (go back to work/school) while practicing hand hygiene, social distance, and mask wearing.  If positive, patient should remain in quarantine for 10 days from symptom onset AND greater than 72 hours after symptoms resolution (absence of fever without the use of fever-reducing medication and improvement in respiratory symptoms), whichever is longer Get plenty of rest and push fluids Use OTC flonase for nasal congestion and runny nose Use medications daily for symptom relief Use  OTC medications like ibuprofen or tylenol as needed fever or pain Call or go to the ED if you have any new or worsening symptoms such as fever, worsening cough, shortness of breath, chest tightness, chest pain, turning blue, changes in mental status.   Reviewed expectations re: course of current medical issues. Questions answered. Outlined signs and symptoms indicating need for more acute intervention. Patient verbalized understanding. After Visit Summary given.         Faustino Congress, NP 10/04/19 [phone]

## 2019-10-04 NOTE — Discharge Instructions (Addendum)
You have an upper respiratory infection  I have sent in azithromycin for you to take, I have also sent in Zyrtec-D for you to help with congestion  Your COVID test is pending.  You should self quarantine until the test result is back.    Take Tylenol as needed for fever or discomfort.  Rest and keep yourself hydrated.    Go to the emergency department if you develop shortness of breath, severe diarrhea, high fever not relieved by Tylenol or ibuprofen, or other concerning symptoms.

## 2019-10-05 ENCOUNTER — Telehealth: Payer: BC Managed Care – PPO | Admitting: Nurse Practitioner

## 2019-10-05 LAB — NOVEL CORONAVIRUS, NAA: SARS-CoV-2, NAA: NOT DETECTED

## 2019-10-05 LAB — SARS-COV-2, NAA 2 DAY TAT

## 2019-12-12 LAB — HM PAP SMEAR

## 2019-12-18 ENCOUNTER — Encounter: Payer: Self-pay | Admitting: Family Medicine

## 2019-12-21 NOTE — Telephone Encounter (Signed)
Front I did do a letter for [employer] It is under his chart Please make this letter available to the mother If she needs anything further to let us know Thanks

## 2020-03-04 ENCOUNTER — Other Ambulatory Visit: Payer: Self-pay | Admitting: Family Medicine

## 2020-03-04 NOTE — Telephone Encounter (Signed)
Last seen for this med 12/27/18

## 2020-03-20 ENCOUNTER — Other Ambulatory Visit: Payer: Self-pay

## 2020-03-20 ENCOUNTER — Ambulatory Visit
Admission: EM | Admit: 2020-03-20 | Discharge: 2020-03-20 | Disposition: A | Discharge status: Home / Self Care | Payer: Managed Care, Other (non HMO)

## 2020-03-20 DIAGNOSIS — R059 Cough, unspecified: Secondary | ICD-10-CM

## 2020-03-20 DIAGNOSIS — Z1152 Encounter for screening for COVID-19: Secondary | ICD-10-CM

## 2020-03-20 DIAGNOSIS — R0981 Nasal congestion: Secondary | ICD-10-CM

## 2020-03-20 NOTE — ED Provider Notes (Signed)
[facility]   [id number] 03/20/20 Arrival Time: 1231   CC: COVID symptoms  SUBJECTIVE: History from: patient.  Tina Ramirez is a 44 y.o. female who presents with nasal congestion and productive cough x few days.  Denies sick exposure to COVID, flu or strep.  Has tried OTC medications without relief.  Denies aggravating factors.  Denies previous covid infection in the past.   Denies fever, chills, sore throat, SOB, wheezing, chest pain, nausea, changes in bowel or bladder habits.    Patient is [redacted] weeks pregnant.    ROS: As per HPI.  All other pertinent ROS negative.     Past Medical History:  Diagnosis Date  . AMA (advanced maternal age) multigravida 35+   . Celiac disease    pt says was a misdiagnosis  . Depression   . HSV (herpes simplex virus) anogenital infection   . IBS (irritable bowel syndrome)   . Vaginal Pap smear, abnormal    Past Surgical History:  Procedure Laterality Date  . ADENOIDECTOMY    . CESAREAN SECTION     x 2  . CESAREAN SECTION N/A 02/03/2016   Procedure: CESAREAN SECTION;  Surgeon: Candice Camp, MD;  Location: [facility]IRTH[facility] SUIT[facility];  Service: Obstetrics;  Laterality: N/A;  Repeat edc 02/09/16 NKDA Needs RNFA  . cold knife conization  2007  . ESOPHAGOGASTRODUODENOSCOPY  10/06/2011   Procedure: ESOPHAGOGASTRODUODENOSCOPY (EGD);  Surgeon: Malissa Hippo, MD;  Location: [facility];  Service: Endoscopy;  Laterality: N/A;  310  . HAND SURGERY     right x 2   . MOUTH SURGERY     No Known Allergies No current facility-administered medications on file prior to encounter.   Current Outpatient Medications on File Prior to Encounter  Medication Sig Dispense Refill  . FLUoxetine (PROZAC) 20 MG capsule TAKE 3 CAPSULES BY MOUTH ONCE A DAY. Needs visit with Dr. Lorin Picket 90 capsule 6  . hydroxyprogesterone caproate (MAKENA) 250 mg/mL OIL injection Inject into the muscle.    . valACYclovir (VALTREX) 500 MG tablet TAKE ONE (1) TABLET BY MOUTH EVERY DAY 30  tablet 5   Social History   Socioeconomic History  . Marital status: Married    Spouse name: Not on file  . Number of children: Not on file  . Years of education: Not on file  . Highest education level: Not on file  Occupational History  . Not on file  Tobacco Use  . Smoking status: Never Smoker  . Smokeless tobacco: Never Used  Substance and Sexual Activity  . Alcohol use: Yes    Comment: occasional  . Drug use: No  . Sexual activity: Never    Birth control/protection: None  Other Topics Concern  . Not on file  Social History Narrative  . Not on file   Social Determinants of Health   Financial Resource Strain:   . Difficulty of Paying Living Expenses: Not on file  Food Insecurity:   . Worried About Programme researcher, broadcasting/film/video in the Last Year: Not on file  . Ran Out of Food in the Last Year: Not on file  Transportation Needs:   . Lack of Transportation (Medical): Not on file  . Lack of Transportation (Non-Medical): Not on file  Physical Activity:   . Days of Exercise per Week: Not on file  . Minutes of Exercise per Session: Not on file  Stress:   . Feeling of Stress : Not on file  Social Connections:   . Frequency of  Communication with Friends and Family: Not on file  . Frequency of Social Gatherings with Friends and Family: Not on file  . Attends Religious Services: Not on file  . Active Member of Clubs or Organizations: Not on file  . Attends Banker Meetings: Not on file  . Marital Status: Not on file  Intimate Partner Violence:   . Fear of Current or Ex-Partner: Not on file  . Emotionally Abused: Not on file  . Physically Abused: Not on file  . Sexually Abused: Not on file   Family History  Problem Relation Age of Onset  . Asthma Son   . Transient ischemic attack Mother     OBJECTIVE:  Vitals:   03/20/20 1256  BP: 96/66  Pulse: (!) 105  Resp: 18  Temp: 98.2 F (36.8 C)  SpO2: 98%    General appearance: alert; appears fatigued, but  nontoxic; speaking in full sentences and tolerating own secretions HEENT: NCAT; Ears: EACs clear, TMs pearly gray; Eyes: PERRL.  EOM grossly intact. Nose: nares patent without rhinorrhea, Throat: oropharynx clear, tonsils non erythematous or enlarged, uvula midline  Neck: supple without LAD Lungs: unlabored respirations, symmetrical air entry; cough: mild, moderate; no respiratory distress; CTAB Heart: regular rate and rhythm.   Skin: warm and dry Psychological: alert and cooperative; normal mood and affect  ASSESSMENT & PLAN:  1. Encounter for screening for COVID-19   2. Nasal congestion   3. Cough    COVID testing ordered.  It will take between 5-7 days for test results.  Someone will contact you regarding abnormal results.    In the meantime: You should remain isolated in your home for 10 days from symptom onset AND greater than 72 hours after symptoms resolution (absence of fever without the use of fever-reducing medication and improvement in respiratory symptoms), whichever is longer Get plenty of rest and push fluids Use OTC zyrtec for nasal congestion, runny nose, and/or sore throat Use OTC flonase for nasal congestion and runny nose Use medications daily for symptom relief Use OTC medications like tylenol as needed fever or pain Follow up with PCP for recheck Call or go to the ED if you have any new or worsening symptoms such as fever, worsening cough, shortness of breath, chest tightness, chest pain, turning blue, changes in mental status, etc...   Reviewed expectations re: course of current medical issues. Questions answered. Outlined signs and symptoms indicating need for more acute intervention. Patient verbalized understanding. After Visit Summary given.         Rennis Harding, PA-C 03/20/20 1258

## 2020-03-20 NOTE — ED Triage Notes (Signed)
Pt presents with nasal congestion and cough for past few days , [redacted] weeks pregnant

## 2020-03-20 NOTE — Discharge Instructions (Signed)
COVID testing ordered.  It will take between 5-7 days for test results.  Someone will contact you regarding abnormal results.    In the meantime: You should remain isolated in your home for 10 days from symptom onset AND greater than 72 hours after symptoms resolution (absence of fever without the use of fever-reducing medication and improvement in respiratory symptoms), whichever is longer Get plenty of rest and push fluids Use OTC zyrtec for nasal congestion, runny nose, and/or sore throat Use OTC flonase for nasal congestion and runny nose Use medications daily for symptom relief Use OTC medications like ibuprofen or tylenol as needed fever or pain Follow up with PCP for recheck Call or go to the ED if you have any new or worsening symptoms such as fever, worsening cough, shortness of breath, chest tightness, chest pain, turning blue, changes in mental status, etc..Marland Kitchen

## 2020-03-22 ENCOUNTER — Emergency Department (HOSPITAL_COMMUNITY)
Admission: EM | Admit: 2020-03-22 | Discharge: 2020-03-22 | Disposition: A | Discharge status: Home / Self Care | Payer: Managed Care, Other (non HMO) | Attending: Emergency Medicine | Admitting: Emergency Medicine

## 2020-03-22 ENCOUNTER — Encounter (HOSPITAL_COMMUNITY): Payer: Self-pay | Admitting: Emergency Medicine

## 2020-03-22 ENCOUNTER — Other Ambulatory Visit: Payer: Self-pay

## 2020-03-22 ENCOUNTER — Emergency Department (HOSPITAL_COMMUNITY): Payer: Managed Care, Other (non HMO)

## 2020-03-22 DIAGNOSIS — U071 COVID-19: Secondary | ICD-10-CM | POA: Insufficient documentation

## 2020-03-22 DIAGNOSIS — O98512 Other viral diseases complicating pregnancy, second trimester: Secondary | ICD-10-CM | POA: Diagnosis not present

## 2020-03-22 DIAGNOSIS — Z3A26 26 weeks gestation of pregnancy: Secondary | ICD-10-CM | POA: Insufficient documentation

## 2020-03-22 DIAGNOSIS — R112 Nausea with vomiting, unspecified: Secondary | ICD-10-CM

## 2020-03-22 LAB — COMPREHENSIVE METABOLIC PANEL
ALT: 22 U/L (ref 0–44)
AST: 35 U/L (ref 15–41)
Albumin: 2.8 g/dL — ABNORMAL LOW (ref 3.5–5.0)
Alkaline Phosphatase: 72 U/L (ref 38–126)
Anion gap: 9 (ref 5–15)
BUN: 9 mg/dL (ref 6–20)
CO2: 22 mmol/L (ref 22–32)
Calcium: 8.2 mg/dL — ABNORMAL LOW (ref 8.9–10.3)
Chloride: 103 mmol/L (ref 98–111)
Creatinine, Ser: 0.52 mg/dL (ref 0.44–1.00)
GFR, Estimated: >60 mL/min (ref >60)
Glucose, Bld: 81 mg/dL (ref 70–99)
Potassium: 3.4 mmol/L — ABNORMAL LOW (ref 3.5–5.1)
Sodium: 134 mmol/L — ABNORMAL LOW (ref 135–145)
Total Bilirubin: 0.3 mg/dL (ref 0.3–1.2)
Total Protein: 6.7 g/dL (ref 6.5–8.1)

## 2020-03-22 LAB — COVID-19, FLU A+B AND RSV
Influenza A, NAA: NOT DETECTED
Influenza B, NAA: NOT DETECTED
RSV, NAA: NOT DETECTED
SARS-CoV-2, NAA: DETECTED — AB

## 2020-03-22 LAB — CBC
HCT: 27 % — ABNORMAL LOW (ref 36.0–46.0)
Hemoglobin: 8.6 g/dL — ABNORMAL LOW (ref 12.0–15.0)
MCH: 31.9 pg (ref 26.0–34.0)
MCHC: 31.9 g/dL (ref 30.0–36.0)
MCV: 100 fL (ref 80.0–100.0)
Platelets: 165 10*3/uL (ref 150–400)
RBC: 2.7 MIL/uL — ABNORMAL LOW (ref 3.87–5.11)
RDW: 12.6 % (ref 11.5–15.5)
WBC: 3.7 10*3/uL — ABNORMAL LOW (ref 4.0–10.5)
nRBC: 0 % (ref 0.0–0.2)

## 2020-03-22 LAB — LIPASE, BLOOD: Lipase: 25 U/L (ref 11–51)

## 2020-03-22 MED ORDER — PROMETHAZINE HCL 25 MG/ML IJ SOLN
25.0000 mg | Freq: Once | INTRAMUSCULAR | Status: AC
  Administered 2020-03-22: 25 mg via INTRAVENOUS
  Filled 2020-03-22: qty 1

## 2020-03-22 MED ORDER — SODIUM CHLORIDE 0.9 % IV SOLN
1200.0000 mg | Freq: Once | INTRAVENOUS | Status: AC
  Administered 2020-03-22: 1200 mg via INTRAVENOUS
  Filled 2020-03-22: qty 10

## 2020-03-22 MED ORDER — ALBUTEROL SULFATE HFA 108 (90 BASE) MCG/ACT IN AERS: 2.0000 | INHALATION_SPRAY | Freq: Once | RESPIRATORY_TRACT | Status: DC | PRN

## 2020-03-22 MED ORDER — METHYLPREDNISOLONE SODIUM SUCC 125 MG IJ SOLR: 125.0000 mg | Freq: Once | INTRAMUSCULAR | Status: DC | PRN

## 2020-03-22 MED ORDER — SODIUM CHLORIDE 0.9 % IV BOLUS
1000.0000 mL | Freq: Once | INTRAVENOUS | Status: AC
  Administered 2020-03-22: 1000 mL via INTRAVENOUS

## 2020-03-22 MED ORDER — ACETAMINOPHEN 325 MG PO TABS
650.0000 mg | ORAL_TABLET | Freq: Once | ORAL | Status: AC
  Administered 2020-03-22: 650 mg via ORAL
  Filled 2020-03-22: qty 2

## 2020-03-22 MED ORDER — DIPHENHYDRAMINE HCL 50 MG/ML IJ SOLN: 50.0000 mg | Freq: Once | INTRAMUSCULAR | Status: DC | PRN

## 2020-03-22 MED ORDER — EPINEPHRINE 0.3 MG/0.3ML IJ SOAJ: 0.3000 mg | Freq: Once | INTRAMUSCULAR | Status: DC | PRN

## 2020-03-22 MED ORDER — FAMOTIDINE IN NACL 20-0.9 MG/50ML-% IV SOLN: 20.0000 mg | Freq: Once | INTRAVENOUS | Status: DC | PRN

## 2020-03-22 MED ORDER — PROMETHAZINE HCL 25 MG PO TABS
25.0000 mg | ORAL_TABLET | Freq: Four times a day (QID) | ORAL | 0 refills | Status: DC | PRN
Start: 2020-03-22 — End: 2020-04-02

## 2020-03-22 NOTE — Progress Notes (Signed)
Received a phone call regarding this patient who presented to the ED--26 wks, [id number], c/o NVD following a covid dx 2 days ago.  Remotely monitored patient for appx 30 mins.  FHR baseline 150s, mod variability, no decels, no cx.  Spoke with Dr. Rana Snare who agreed with the plan to take pt off the monitor.  Spoke with pt's nurse and informed her to call back with any further issues or if pt becomes admitted into the hospital.  Marcelle Overlie, RN, BSN, CEN, RNC-OB OB rapid response RN

## 2020-03-22 NOTE — ED Notes (Signed)
RN went to reassess Pt and Pts Tylenol was sitting on bedside table. Pt advised it would help with her fever and Pt agreed to take PO Tylenol at this point in time. Pts IV fluids still running.

## 2020-03-22 NOTE — ED Triage Notes (Signed)
Pt tested positive for covid on 03/20/20. Pt is [redacted] weeks pregnant and states she is unable to keep food down. C/o N/V/D.  Pt is [redacted] weeks pregnant.

## 2020-03-22 NOTE — ED Notes (Signed)
Spoke with OB rapid response who states fetal monitoring looks great, and can be discontinued. Want to be notified if pt condition worsens or if admitted.

## 2020-03-22 NOTE — Discharge Instructions (Signed)
Please continue to quarantine at home and monitor your symptoms closely. You chest x-ray does show signs of Covid pneumonia, but this appears mild and your oxygen saturations have been reassuring today. Antibiotics are not helpful in treating viral infection, the virus should run its course in about 10-14 days. Please make sure you are drinking plenty of fluids. You can treat your symptoms supportively with tylenol for fevers and pains, and over the counter cough syrups and throat lozenges to help with cough. If your symptoms are not improving please follow up with you Primary doctor.   Use Phenergan every 6 hours as needed for nausea and vomiting.  Start with clear liquids you can use things like Gatorade or Pedialyte to ensure you are getting electrolytes, and then add back bland foods into your diet.  You were given the Covid monoclonal antibody infusion today, this should hopefully help prevent you from developing more serious symptoms that require hospitalization, but in some cases symptoms still worsen.  I recommend that you purchase a home pulse ox to help better monitor your oxygen at home, if you start to have increased work of breathing or shortness of breath or your oxygen drops below 90% please immediately return to the hospital for reevaluation.  If you develop persistent fevers, shortness of breath or difficulty breathing, chest pain, severe headache and neck pain, persistent nausea and vomiting or other new or concerning symptoms return to the Emergency department.

## 2020-03-22 NOTE — ED Notes (Signed)
Pt tolerated PO Fluids w/o complication. EDP notified.

## 2020-03-22 NOTE — ED Provider Notes (Signed)
[facility] EMERGENCY DEPARTMENT Provider Note   CSN: [id number] Arrival date & time: 03/22/20  1534     History Chief Complaint  Patient presents with  . Emesis    Tina Ramirez is a 44 y.o. female.  Tina Ramirez is a 44 y.o. female who is currently [redacted] weeks pregnant, who presents with COVID infection and nausea and vomiting. Patient tested positive on 12/2 after symptoms began on 11/27 after she was gathered with her family for Thanksgiving.  Patient did not know of any Covid exposures, is unvaccinated.  States that she has been having fevers, cough, congestion and fatigue.  And over the past 2 days has had persistent nausea and vomiting, has not been able to keep anything down.  States she feels weak and lightheaded.  Has also had some diarrhea, tried some over-the-counter medication for diarrhea without improvement.  Is concerned that she is getting dehydrated.  She denies any abdominal pain.  Her pregnancy has been uncomplicated thus far aside from advanced maternal age, she has still been feeling good fetal movement has not had any vaginal bleeding or leakage of fluids.        Past Medical History:  Diagnosis Date  . AMA (advanced maternal age) multigravida 35+   . Celiac disease    pt says was a misdiagnosis  . Depression   . HSV (herpes simplex virus) anogenital infection   . IBS (irritable bowel syndrome)   . Vaginal Pap smear, abnormal     Patient Active Problem List   Diagnosis Date Noted  . Cesarean delivery delivered 02/03/2016  . Generalized anxiety disorder 08/02/2012  . 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 Nausea and vomiting 08/26/2011    Past Surgical History:  Procedure Laterality Date  . ADENOIDECTOMY    . CESAREAN SECTION     x 2  . CESAREAN SECTION N/A 02/03/2016   Procedure: CESAREAN SECTION;  Surgeon: Candice Camp, MD;  Location: [facility] BIRTHING SUITES;  Service: Obstetrics;  Laterality: N/A;  Repeat edc 02/09/16 NKDA Needs RNFA  . cold  knife conization  2007  . ESOPHAGOGASTRODUODENOSCOPY  10/06/2011   Procedure: ESOPHAGOGASTRODUODENOSCOPY (EGD);  Surgeon: Malissa Hippo, MD;  Location: [facility] ENDO [facility];  Service: Endoscopy;  Laterality: N/A;  310  . HAND SURGERY     right x 2   . MOUTH SURGERY       OB History    Gravida  5   Para  4   Term  1   Preterm  3   AB      Living  2     SAB      TAB      Ectopic      Multiple  0   Live Births  2           Family History  Problem Relation Age of Onset  . Asthma Son   . Transient ischemic attack Mother     Social History   Tobacco Use  . Smoking status: Never Smoker  . Smokeless tobacco: Never Used  Vaping Use  . Vaping Use: Never used  Substance Use Topics  . Alcohol use: Not Currently  . Drug use: No    Home Medications Prior to Admission medications   Medication Sig Start Date End Date Taking? Authorizing Provider  FLUoxetine (PROZAC) 20 MG capsule TAKE 3 CAPSULES BY MOUTH ONCE A DAY. Needs visit with Dr. Lorin Picket 12/27/18   Babs Sciara, MD  hydroxyprogesterone caproate (  MAKENA) 250 mg/mL OIL injection Inject into the muscle. 02/21/20   [provider]  promethazine (PHENERGAN) 25 MG tablet Take 1 tablet (25 mg total) by mouth every 6 (six) hours as needed for nausea or vomiting. 03/22/20   Dartha Lodge, PA-C  valACYclovir (VALTREX) 500 MG tablet TAKE ONE (1) TABLET BY MOUTH EVERY DAY 03/04/20   Babs Sciara, MD    Allergies    Patient has no known allergies.  Review of Systems   Review of Systems  Constitutional: Positive for chills, fatigue and fever.  HENT: Positive for congestion and rhinorrhea.   Respiratory: Positive for cough. Negative for shortness of breath.   Cardiovascular: Negative for chest pain.  Gastrointestinal: Positive for diarrhea, nausea and vomiting. Negative for abdominal pain.  Genitourinary: Negative for dysuria, frequency, vaginal bleeding and vaginal discharge.  Musculoskeletal: Positive for  myalgias. Negative for arthralgias.  Skin: Negative for color change and rash.  Neurological: Negative for weakness and headaches.    Physical Exam Updated Vital Signs BP 120/83   Pulse (!) 104   Temp 101.5 F (38.6 C) (Oral)   Resp 18   Ht 5\' 1"  (1.549 m)   Wt 56.7 kg   LMP 08/22/2019   SpO2 98%   BMI 23.62 kg/m   Physical Exam Vitals and nursing note reviewed.  Constitutional:      General: She is not in acute distress.    Appearance: Normal appearance. She is well-developed. She is ill-appearing. She is not diaphoretic.     Comments: Pregnant female, somewhat ill-appearing, but in no distress  HENT:     Head: Normocephalic and atraumatic.     Nose: Congestion and rhinorrhea present.     Mouth/Throat:     Mouth: Mucous membranes are dry.     Pharynx: Oropharynx is clear.     Comments: Mucous membranes slightly dry Eyes:     General:        Right eye: No discharge.        Left eye: No discharge.  Neck:     Comments: No rigidity Cardiovascular:     Rate and Rhythm: Regular rhythm. Tachycardia present.     Heart sounds: Normal heart sounds.     Comments: Tachycardia with regular rhythym Pulmonary:     Effort: Pulmonary effort is normal. No respiratory distress.     Breath sounds: Normal breath sounds.     Comments: Respirations equal and unlabored, patient able to speak in full sentences, lungs clear to auscultation bilaterally Abdominal:     General: Bowel sounds are normal. There is no distension.     Palpations: There is no mass.     Tenderness: There is no abdominal tenderness. There is no guarding.     Comments: .Gravid abdomen, non tender  Musculoskeletal:        General: No deformity.     Cervical back: Neck supple.  Lymphadenopathy:     Cervical: No cervical adenopathy.  Skin:    General: Skin is warm and dry.     Capillary Refill: Capillary refill takes less than 2 seconds.  Neurological:     Mental Status: She is alert and oriented to person,  place, and time.  Psychiatric:        Mood and Affect: Mood normal.        Behavior: Behavior normal.     ED Results / Procedures / Treatments   Labs (all labs ordered are listed, but only abnormal results are displayed) Labs  Reviewed  COMPREHENSIVE METABOLIC PANEL - Abnormal; Notable for the following components:      Result Value   Sodium 134 (*)    Potassium 3.4 (*)    Calcium 8.2 (*)    Albumin 2.8 (*)    All other components within normal limits  CBC - Abnormal; Notable for the following components:   WBC 3.7 (*)    RBC 2.70 (*)    Hemoglobin 8.6 (*)    HCT 27.0 (*)    All other components within normal limits  LIPASE, BLOOD    EKG None  Radiology DG Chest Port 1 View  Result Date: 03/22/2020 CLINICAL DATA:  COVID-19 EXAM: PORTABLE CHEST 1 VIEW COMPARISON:  None. FINDINGS: Patchy basilar and peripheral predominant opacities are present in the lungs. No pneumothorax or effusion. The cardiomediastinal contours are unremarkable. No acute osseous or soft tissue abnormality. IMPRESSION: Patchy basilar and peripheral predominant opacities in the lungs compatible with multifocal pneumonia in the setting of COVID-19. Electronically Signed   By: Kreg ShropshirePrice  DeHay M.D.   On: 03/22/2020 19:16    Procedures Procedures (including critical care time)  Medications Ordered in ED Medications  albuterol (VENTOLIN HFA) 108 (90 Base) MCG/ACT inhaler 2 puff (has no administration in time range)  diphenhydrAMINE (BENADRYL) injection 50 mg (has no administration in time range)  EPINEPHrine (EPI-PEN) injection 0.3 mg (has no administration in time range)  famotidine (PEPCID) IVPB 20 mg premix (has no administration in time range)  methylPREDNISolone sodium succinate (SOLU-MEDROL) 125 mg/2 mL injection 125 mg (has no administration in time range)  sodium chloride 0.9 % bolus 1,000 mL (1,000 mLs Intravenous New Bag/Given 03/22/20 1834)  promethazine (PHENERGAN) injection 25 mg (25 mg Intravenous  Given 03/22/20 1835)  acetaminophen (TYLENOL) tablet 650 mg (650 mg Oral Given 03/22/20 1848)  casirivimab-imdevimab (REGEN-COV) 1,200 mg in sodium chloride 0.9 % 110 mL IVPB (0 mg Intravenous Stopped 03/22/20 2219)    ED Course  I have reviewed the triage vital signs and the nursing notes.  Pertinent labs & imaging results that were available during my care of the patient were reviewed by me and considered in my medical decision making (see chart for details).    MDM Rules/Calculators/A&P                         44 year old female Covid positive on day 8 of symptoms here with worsening nausea and vomiting over the last 2 days unable to keep anything down, having some cough and congestion but no shortness of breath, no hypoxia here. Patient is febrile and tachycardic, treated with Tylenol. She had some active vomiting initially upon arrival. Will check labs, chest x-ray, and treat with IV fluids and Phenergan. Patient on fetal monitoring here in the ED maintaining appropriate fetal heart rates, rapid OB RN contacted, and after patient was monitored for about 20 minutes, case was discussed with on-call provider her OB office, who reports monitoring looks good and this can be discontinued.  Work-up shows leukopenia as expected with Covid, hemoglobin of 8.6, consistent with prior hemoglobin. Mild hyponatremia and hypokalemia, likely in the setting of dehydration, normal renal and liver function. Normal lipase.  Chest x-ray with some mild Covid pneumonia.  Given patient's pregnancy she is at high risk for worsening with Covid and only has 2 more days of eligibility for monoclonal antibody infusion. Discussed the risks versus benefits with this with patient at length and she would like to receive infusion.  Initially order set would not allow me to order Covid infusion for pregnant patient at [facility] due to monitoring needs but we have full pregnancy monitoring capabilities here at [facility], I  spoke with pharmacist at [facility], Joaquim Lai and he was able to order the monoclonal antibody for the patient.  Patient received a monoclonal antibody infusion here in the ED, she tolerated this well with no reaction or complications.  She is tolerating p.o., vital signs have improved and overall she is feeling improved.  At this time she is stable for discharge home with close outpatient follow-up with her OB/GYN.  Prescribed Phenergan to help with nausea and vomiting and encouraged to make sure she is drinking plenty of fluids.  Has home pulse ox at home to monitor oxygen, strict return precautions discussed.  Discharged home in good condition.  DALIA JOLLIE was evaluated in Emergency Department on 03/22/2020 for the symptoms described in the history of present illness. She was evaluated in the context of the global COVID-19 pandemic, which necessitated consideration that the patient might be at risk for infection with the SARS-CoV-2 virus that causes COVID-19. Institutional protocols and algorithms that pertain to the evaluation of patients at risk for COVID-19 are in a state of rapid change based on information released by regulatory bodies including the CDC and federal and state organizations. These policies and algorithms were followed during the patient's care in the ED.  Final Clinical Impression(s) / ED Diagnoses Final diagnoses:  COVID-19 virus infection  Nausea vomiting and diarrhea    Rx / DC Orders ED Discharge Orders         Ordered    promethazine (PHENERGAN) 25 MG tablet  Every 6 hours PRN        03/22/20 2311           Dartha Lodge, PA-C 03/25/20 0132    Derwood Kaplan, MD 03/31/20 [phone]

## 2020-03-25 ENCOUNTER — Telehealth: Payer: Self-pay

## 2020-03-25 ENCOUNTER — Other Ambulatory Visit: Payer: Self-pay | Admitting: *Deleted

## 2020-03-25 MED ORDER — ALBUTEROL SULFATE HFA 108 (90 BASE) MCG/ACT IN AERS
2.0000 | INHALATION_SPRAY | RESPIRATORY_TRACT | 0 refills | Status: DC | PRN
Start: 2020-03-25 — End: 2020-05-29

## 2020-03-25 NOTE — Telephone Encounter (Signed)
Discussed with pt. Pt verbalized understanding. Inhaler sent to pharm.

## 2020-03-25 NOTE — Telephone Encounter (Signed)
Nurses I was able to see that she was in the ER that they did a chest x-ray and also did infusion of monoclonal antibodies Hopefully this will help her as it should  Please see how the patient doing today in regards to her breathing.  Any worsening of condition?

## 2020-03-25 NOTE — Telephone Encounter (Signed)
FYI- Patient wanted you to know she has Covid-pneumonia. She went to ER yesterday having cough and shortness of breathe.

## 2020-03-25 NOTE — Telephone Encounter (Signed)
It is not unusual to have some shortness of breath with this but if the shortness of breath gets progressively worse she would need to go back to the [facility] of the shortness of breath gets worse that could be a sign that the Covid pneumonia is getting worse If she is having any wheezing or tightness in her lungs she may utilize albuterol 2 puffs every 4 as needed, may send in one inhaler  Also patient can [facility] need to be in the 90s preferably 92 or more.  If O2 saturations are in the 80s that is a clear sign to go back to the ER  Please call if any other questions or concerns

## 2020-03-25 NOTE — Telephone Encounter (Signed)
Pt states she is sob when up and moving around. Same as when seen at the ED. No worsening of the condition. Wants to know if she can have something for sob.  [facility].

## 2020-03-26 ENCOUNTER — Other Ambulatory Visit: Payer: Self-pay

## 2020-03-26 ENCOUNTER — Telehealth (INDEPENDENT_AMBULATORY_CARE_PROVIDER_SITE_OTHER): Payer: Managed Care, Other (non HMO) | Admitting: Family Medicine

## 2020-03-26 ENCOUNTER — Telehealth: Payer: Self-pay | Admitting: *Deleted

## 2020-03-26 DIAGNOSIS — J1282 Pneumonia due to coronavirus disease 2019: Secondary | ICD-10-CM | POA: Diagnosis not present

## 2020-03-26 DIAGNOSIS — J019 Acute sinusitis, unspecified: Secondary | ICD-10-CM

## 2020-03-26 DIAGNOSIS — U071 COVID-19: Secondary | ICD-10-CM

## 2020-03-26 MED ORDER — AMOXICILLIN 500 MG PO CAPS: 500.0000 mg | ORAL_CAPSULE | Freq: Three times a day (TID) | ORAL | 0 refills | Status: DC

## 2020-03-26 NOTE — Progress Notes (Signed)
Patient ID: Tina Ramirez, female    DOB: 10/07/1975, 44 y.o.   MRN: [id number]   Virtual Visit via Video Note  I connected with Tina Ramirez on 03/26/20 at  3:30 PM EST by a video enabled telemedicine application and verified that I am speaking with the correct person using two identifiers.  Location: Patient: home Provider: office   I discussed the limitations of evaluation and management by telemedicine and the availability of in person appointments. The patient expressed understanding and agreed to proceed.    Chief Complaint  Patient presents with  . ER follow up    Covid Pneumonia- [redacted] weeks pregnant- given phenergan via ER but no other meds- Still feeling bad and green congestion    Subjective:    HPI  F/u for worsening covid symptoms.  Pt has covid pneumonia, pt is [redacted] wks pregnant.  Took covid 2 wks ago, positive.  Okay for 1 wk, then started to worsen. Went to urgent care later yellow-green drainage from nose.  Nothing given from urgent care for medications. Pt seen ER on 03/22/20 due to not able to eat or drink. And having n/v. Dx with covid pneumonia on xray.  Gave phenergan from ER and symptomatic tx for covid pneumonia.  Pt received the monoclonal ab in the ER.  Pt feeling like she has an infection in her nose. Using nasal spray and hot showers.  No fever.   Spoke with ob/gyn, but they didn't feel she needed abx. Seeing Dr. Rana Snare, gyn. Getting progesterone injections from car visits.   Pt got the monoclonal ab infusion while in the ER on 03/22/20. Still having coughing, no sore throat.  No vomiting or diarrhea.  Not had to take phenergan.  Eating and drinking okay.   Has pulse oximeter at home- seeing 95%.  HR- 85.  Checked while on phone.  Pt has been out of work since 03/17/20- till this coming Monday.  Send to [employer].   Medical History Tina Ramirez has a past medical history of AMA (advanced maternal age) multigravida 35+, Celiac disease, Depression, HSV  (herpes simplex virus) anogenital infection, IBS (irritable bowel syndrome), and Vaginal Pap smear, abnormal.   Outpatient Encounter Medications as of 03/26/2020  Medication Sig  . albuterol (VENTOLIN HFA) 108 (90 Base) MCG/ACT inhaler Inhale 2 puffs into the lungs every 4 (four) hours as needed for wheezing or shortness of breath.  Marland Kitchen amoxicillin (AMOXIL) 500 MG capsule Take 1 capsule (500 mg total) by mouth 3 (three) times daily.  Marland Kitchen FLUoxetine (PROZAC) 20 MG capsule TAKE 3 CAPSULES BY MOUTH ONCE A DAY. Needs visit with Dr. Lorin Picket  . hydroxyprogesterone caproate (MAKENA) 250 mg/mL OIL injection Inject into the muscle.  . promethazine (PHENERGAN) 25 MG tablet Take 1 tablet (25 mg total) by mouth every 6 (six) hours as needed for nausea or vomiting.  . valACYclovir (VALTREX) 500 MG tablet TAKE ONE (1) TABLET BY MOUTH EVERY DAY   No facility-administered encounter medications on file as of 03/26/2020.     Review of Systems  Constitutional: Negative for chills and fever.  HENT: Positive for congestion and sinus pain. Negative for ear pain, rhinorrhea, sinus pressure and sore throat.   Eyes: Negative for pain, discharge and itching.  Respiratory: Negative for cough.   Gastrointestinal: Negative for constipation, diarrhea, nausea and vomiting.     Vitals LMP 08/22/2019   Objective:   Physical Exam  No PE due to phone visit.  Assessment and Plan   1.  Acute non-recurrent sinusitis, unspecified location - amoxicillin (AMOXIL) 500 MG capsule; Take 1 capsule (500 mg total) by mouth 3 (three) times daily.  Dispense: 30 capsule; Refill: 0  2. Pneumonia due to COVID-19 virus  Advising symptomatic tx for viral illness. Reviewed usual course of covid viral illness vs. Sinusitis and antibiotic use.  Gave script for antibiotics to take, but reviewed that if it's viral illness it might not get better with abx and may still run course of 7-10 days.  increase fluids and call if not improving in next  2-3 days.  Medication recommendations given.  robitussin -bid with increase in fluids.  Saline nasal rinses for congestion. Tylenol prn for bodyaches/fever. Call us or ob/gyn if worsening symptoms. Call in 2 days to give [place] an update.   Pt in agreement with plan. F/u prn.   Follow Up Instructions:    I discussed the assessment and treatment plan with the patient. The patient was provided an opportunity to ask questions and all were answered. The patient agreed with the plan and demonstrated an understanding of the instructions.   The patient was advised to call back or seek an in-person evaluation if the symptoms worsen or if the condition fails to improve as anticipated.  I provided 15 minutes of non-face-to-face time during this encounter.

## 2020-03-26 NOTE — Telephone Encounter (Signed)
Ms. koralynn, greenspan are scheduled for a virtual visit with your provider today.    Just as we do with appointments in the office, we must obtain your consent to participate.  Your consent will be active for this visit and any virtual visit you may have with one of our providers in the next 365 days.    If you have a MyChart account, I can also send a copy of this consent to you electronically.  All virtual visits are billed to your insurance company just like a traditional visit in the office.  As this is a virtual visit, video technology does not allow for your provider to perform a traditional examination.  This may limit your provider's ability to fully assess your condition.  If your provider identifies any concerns that need to be evaluated in person or the need to arrange testing such as labs, EKG, etc, we will make arrangements to do so.    Although advances in technology are sophisticated, we cannot ensure that it will always work on either your end or our end.  If the connection with a video visit is poor, we may have to switch to a telephone visit.  With either a video or telephone visit, we are not always able to ensure that we have a secure connection.   I need to obtain your verbal consent now.   Are you willing to proceed with your visit today?   DEJHA KING has provided verbal consent on 03/26/2020 for a virtual visit (video or telephone).   Kathleen Lime, RN 03/26/2020  3:00 PM

## 2020-03-27 ENCOUNTER — Encounter: Payer: Self-pay | Admitting: Family Medicine

## 2020-04-01 ENCOUNTER — Encounter: Payer: Self-pay | Admitting: Family Medicine

## 2020-04-01 NOTE — Telephone Encounter (Signed)
Pt contacted and offered 4:10 pm appt today. Pt declined and would like an appt tomorrow. Placed on schedule for 3:30 Dec 15.

## 2020-04-01 NOTE — Telephone Encounter (Signed)
Nurses This would require a in person visit in order to listen to her lungs and check her oxygen saturation.  Then at that point time we can discuss whether or not it is beneficial to do any additional test such as additional x-ray.(It is not that we are against doing x-rays but more because we want to minimize x-ray radiation when pregnant-and therefore it would be wise to be seen in person to evaluate rather than just order an x-ray without seeing her, plus it would be wise to listen to the lungs and check oxygen saturation)  This would be either at the end of today or Wednesday afternoon with me

## 2020-04-02 ENCOUNTER — Encounter (HOSPITAL_COMMUNITY): Payer: Self-pay | Admitting: *Deleted

## 2020-04-02 ENCOUNTER — Encounter: Payer: Self-pay | Admitting: Family Medicine

## 2020-04-02 ENCOUNTER — Other Ambulatory Visit: Payer: Self-pay

## 2020-04-02 ENCOUNTER — Ambulatory Visit (HOSPITAL_COMMUNITY)
Admission: RE | Admit: 2020-04-02 | Discharge: 2020-04-02 | Disposition: A | Discharge status: Home / Self Care | Payer: Managed Care, Other (non HMO) | Source: Ambulatory Visit | Attending: Family Medicine | Admitting: Family Medicine

## 2020-04-02 ENCOUNTER — Inpatient Hospital Stay (HOSPITAL_COMMUNITY)
Admission: EM | Admit: 2020-04-02 | Discharge: 2020-04-03 | Disposition: A | Discharge status: Home / Self Care | Payer: Managed Care, Other (non HMO) | Attending: Obstetrics and Gynecology | Admitting: Obstetrics and Gynecology

## 2020-04-02 ENCOUNTER — Ambulatory Visit: Payer: Managed Care, Other (non HMO) | Admitting: Family Medicine

## 2020-04-02 ENCOUNTER — Other Ambulatory Visit (HOSPITAL_COMMUNITY)
Admission: RE | Admit: 2020-04-02 | Discharge: 2020-04-02 | Disposition: A | Discharge status: Home / Self Care | Payer: Managed Care, Other (non HMO) | Source: Ambulatory Visit | Attending: *Deleted | Admitting: *Deleted

## 2020-04-02 VITALS — HR 107 | Temp 97.6°F | Ht 61.0 in | Wt 129.0 lb

## 2020-04-02 DIAGNOSIS — R0602 Shortness of breath: Secondary | ICD-10-CM | POA: Diagnosis present

## 2020-04-02 DIAGNOSIS — J1282 Pneumonia due to coronavirus disease 2019: Secondary | ICD-10-CM | POA: Insufficient documentation

## 2020-04-02 DIAGNOSIS — U071 COVID-19: Secondary | ICD-10-CM

## 2020-04-02 DIAGNOSIS — O99513 Diseases of the respiratory system complicating pregnancy, third trimester: Secondary | ICD-10-CM | POA: Insufficient documentation

## 2020-04-02 DIAGNOSIS — O99012 Anemia complicating pregnancy, second trimester: Secondary | ICD-10-CM | POA: Diagnosis not present

## 2020-04-02 DIAGNOSIS — O99512 Diseases of the respiratory system complicating pregnancy, second trimester: Secondary | ICD-10-CM | POA: Insufficient documentation

## 2020-04-02 DIAGNOSIS — R0600 Dyspnea, unspecified: Secondary | ICD-10-CM | POA: Diagnosis not present

## 2020-04-02 DIAGNOSIS — Z3A26 26 weeks gestation of pregnancy: Secondary | ICD-10-CM

## 2020-04-02 DIAGNOSIS — O98512 Other viral diseases complicating pregnancy, second trimester: Secondary | ICD-10-CM | POA: Diagnosis not present

## 2020-04-02 DIAGNOSIS — R0609 Other forms of dyspnea: Secondary | ICD-10-CM

## 2020-04-02 DIAGNOSIS — Z3A27 27 weeks gestation of pregnancy: Secondary | ICD-10-CM | POA: Diagnosis not present

## 2020-04-02 DIAGNOSIS — D649 Anemia, unspecified: Secondary | ICD-10-CM | POA: Insufficient documentation

## 2020-04-02 DIAGNOSIS — Z3A28 28 weeks gestation of pregnancy: Secondary | ICD-10-CM | POA: Insufficient documentation

## 2020-04-02 DIAGNOSIS — O98513 Other viral diseases complicating pregnancy, third trimester: Secondary | ICD-10-CM | POA: Insufficient documentation

## 2020-04-02 LAB — CBC WITH DIFFERENTIAL/PLATELET
Abs Immature Granulocytes: 0.03 10*3/uL (ref 0.00–0.07)
Basophils Absolute: 0 10*3/uL (ref 0.0–0.1)
Basophils Relative: 0 %
Eosinophils Absolute: 0.1 10*3/uL (ref 0.0–0.5)
Eosinophils Relative: 1 %
HCT: 24 % — ABNORMAL LOW (ref 36.0–46.0)
Hemoglobin: 7.5 g/dL — ABNORMAL LOW (ref 12.0–15.0)
Immature Granulocytes: 1 %
Lymphocytes Relative: 22 %
Lymphs Abs: 1.3 10*3/uL (ref 0.7–4.0)
MCH: 31.6 pg (ref 26.0–34.0)
MCHC: 31.3 g/dL (ref 30.0–36.0)
MCV: 101.3 fL — ABNORMAL HIGH (ref 80.0–100.0)
Monocytes Absolute: 0.4 10*3/uL (ref 0.1–1.0)
Monocytes Relative: 7 %
Neutro Abs: 4 10*3/uL (ref 1.7–7.7)
Neutrophils Relative %: 69 %
Platelets: 314 10*3/uL (ref 150–400)
RBC: 2.37 MIL/uL — ABNORMAL LOW (ref 3.87–5.11)
RDW: 13.2 % (ref 11.5–15.5)
WBC: 5.8 10*3/uL (ref 4.0–10.5)
nRBC: 0 % (ref 0.0–0.2)

## 2020-04-02 LAB — COMPREHENSIVE METABOLIC PANEL
ALT: 10 U/L (ref 0–44)
AST: 17 U/L (ref 15–41)
Albumin: 2.1 g/dL — ABNORMAL LOW (ref 3.5–5.0)
Alkaline Phosphatase: 61 U/L (ref 38–126)
Anion gap: 11 (ref 5–15)
BUN: 6 mg/dL (ref 6–20)
CO2: 24 mmol/L (ref 22–32)
Calcium: 8.1 mg/dL — ABNORMAL LOW (ref 8.9–10.3)
Chloride: 103 mmol/L (ref 98–111)
Creatinine, Ser: 0.58 mg/dL (ref 0.44–1.00)
GFR, Estimated: >60 mL/min (ref >60)
Glucose, Bld: 80 mg/dL (ref 70–99)
Potassium: 3 mmol/L — ABNORMAL LOW (ref 3.5–5.1)
Sodium: 138 mmol/L (ref 135–145)
Total Bilirubin: 0.4 mg/dL (ref 0.3–1.2)
Total Protein: 5.3 g/dL — ABNORMAL LOW (ref 6.5–8.1)

## 2020-04-02 LAB — HEPATIC FUNCTION PANEL
ALT: 12 U/L (ref 0–44)
AST: 17 U/L (ref 15–41)
Albumin: 2.3 g/dL — ABNORMAL LOW (ref 3.5–5.0)
Alkaline Phosphatase: 63 U/L (ref 38–126)
Bilirubin, Direct: <0.1 mg/dL (ref 0.0–0.2)
Total Bilirubin: 0.3 mg/dL (ref 0.3–1.2)
Total Protein: 6 g/dL — ABNORMAL LOW (ref 6.5–8.1)

## 2020-04-02 LAB — BASIC METABOLIC PANEL
Anion gap: 8 (ref 5–15)
BUN: 7 mg/dL (ref 6–20)
CO2: 26 mmol/L (ref 22–32)
Calcium: 8 mg/dL — ABNORMAL LOW (ref 8.9–10.3)
Chloride: 104 mmol/L (ref 98–111)
Creatinine, Ser: 0.53 mg/dL (ref 0.44–1.00)
GFR, Estimated: >60 mL/min (ref >60)
Glucose, Bld: 91 mg/dL (ref 70–99)
Potassium: 3.4 mmol/L — ABNORMAL LOW (ref 3.5–5.1)
Sodium: 138 mmol/L (ref 135–145)

## 2020-04-02 LAB — CBC
HCT: 21.8 % — ABNORMAL LOW (ref 36.0–46.0)
Hemoglobin: 7.1 g/dL — ABNORMAL LOW (ref 12.0–15.0)
MCH: 32 pg (ref 26.0–34.0)
MCHC: 32.6 g/dL (ref 30.0–36.0)
MCV: 98.2 fL (ref 80.0–100.0)
Platelets: 321 10*3/uL (ref 150–400)
RBC: 2.22 MIL/uL — ABNORMAL LOW (ref 3.87–5.11)
RDW: 13.2 % (ref 11.5–15.5)
WBC: 6.8 10*3/uL (ref 4.0–10.5)
nRBC: 0 % (ref 0.0–0.2)

## 2020-04-02 LAB — D-DIMER, QUANTITATIVE: D-Dimer, Quant: 0.57 ug/mL-FEU — ABNORMAL HIGH (ref 0.00–0.50)

## 2020-04-02 NOTE — Progress Notes (Signed)
   Subjective:    Patient ID: Tina Ramirez, female    DOB: 1975/11/06, 44 y.o.   MRN: [id number]  HPI  Patient presents today with respiratory illness Number of days present- Concerning symptoms Cough congestion shortness of breath Progressive Getting short of breath moving around from room to room O2 saturation today 99 temperature normal Patient had previous ER visit as well as chest x-ray  Symptoms include- cough and sob.   Presence of worrisome signs (severe shortness of breath, lethargy, etc.) - dob  Recent/current visit to urgent care or ER-none  Recent direct exposure to Covid- pt is positive for covid Any current Covid testing- pt tested positive 13 days ago   Swelling in legs. Pt is [redacted] weeks pregnant. Sees ob tomorrow.   Review of Systems Please see above    Objective:   Physical Exam Congested cough noted not tachypneic blood pressure 110/70 temperature normal O2 saturation 99 not tachypneic heart rate normal extremities some edema       Assessment & Plan:  Covid pneumonia Repeat chest x-ray because of increased shortness of breath D-dimer ordered Lab work ordered Hemoglobin came back lower Chest x-ray shows increased Covid pneumonia D-dimer positive Will need VQ scan to rule out possibility of pulmonary embolus With the pregnancy as well as a Covid pneumonia immediately go to [facility] ER triage was spoken with.  Patient was spoken with.  MyChart message sent.

## 2020-04-02 NOTE — ED Notes (Signed)
Ob rapid response has been called  They want to be called whenever the pt is in a room

## 2020-04-02 NOTE — ED Triage Notes (Signed)
The pt is [redacted] weeks pregnant she has had covid pneumonia for 2 weeks  She was seen at [facility] today had lab work anda chest cray she was sen here  For further lab work

## 2020-04-03 ENCOUNTER — Encounter (HOSPITAL_COMMUNITY): Payer: Self-pay | Admitting: *Deleted

## 2020-04-03 DIAGNOSIS — U071 COVID-19: Secondary | ICD-10-CM

## 2020-04-03 DIAGNOSIS — D649 Anemia, unspecified: Secondary | ICD-10-CM

## 2020-04-03 DIAGNOSIS — O98512 Other viral diseases complicating pregnancy, second trimester: Secondary | ICD-10-CM

## 2020-04-03 DIAGNOSIS — Z3A27 27 weeks gestation of pregnancy: Secondary | ICD-10-CM

## 2020-04-03 DIAGNOSIS — J1282 Pneumonia due to coronavirus disease 2019: Secondary | ICD-10-CM

## 2020-04-03 DIAGNOSIS — O99012 Anemia complicating pregnancy, second trimester: Secondary | ICD-10-CM

## 2020-04-03 MED ORDER — SODIUM CHLORIDE 0.9 % IV SOLN
510.0000 mg | Freq: Once | INTRAVENOUS | Status: AC
  Administered 2020-04-03: 510 mg via INTRAVENOUS
  Filled 2020-04-03: qty 17

## 2020-04-03 MED ORDER — GUAIFENESIN ER 600 MG PO TB12: 600.0000 mg | ORAL_TABLET | Freq: Two times a day (BID) | ORAL | 2 refills | Status: DC

## 2020-04-03 MED ORDER — SODIUM CHLORIDE 0.9 % IV SOLN: INTRAVENOUS | Status: DC

## 2020-04-03 NOTE — Discharge Instructions (Signed)
10 Things You Can Do to Manage Your COVID-19 Symptoms at Home If you have possible or confirmed COVID-19: 1. Stay home from work and school. And stay away from other public places. If you must go out, avoid using any kind of public transportation, ridesharing, or taxis. 2. Monitor your symptoms carefully. If your symptoms get worse, call your healthcare provider immediately. 3. Get rest and stay hydrated. 4. If you have a medical appointment, call the healthcare provider ahead of time and tell them that you have or may have COVID-19. 5. For medical emergencies, call 911 and notify the dispatch personnel that you have or may have COVID-19. 6. Cover your cough and sneezes with a tissue or use the inside of your elbow. 7. Wash your hands often with soap and water for at least 20 seconds or clean your hands with an alcohol-based hand sanitizer that contains at least 60% alcohol. 8. As much as possible, stay in a specific room and away from other people in your home. Also, you should use a separate bathroom, if available. If you need to be around other people in or outside of the home, wear a mask. 9. Avoid sharing personal items with other people in your household, like dishes, towels, and bedding. 10. Clean all surfaces that are touched often, like counters, tabletops, and doorknobs. Use household cleaning sprays or wipes according to the label instructions. cdc.gov/coronavirus 10/18/2018 This information is not intended to replace advice given to you by your health care provider. Make sure you discuss any questions you have with your health care provider. Document Revised: 03/22/2019 Document Reviewed: 03/22/2019 Elsevier Patient Education  2020 Elsevier Inc.   COVID-19 Frequently Asked Questions COVID-19 (coronavirus disease) is an infection that is caused by a large family of viruses. Some viruses cause illness in people and others cause illness in animals like camels, cats, and bats. In some  cases, the viruses that cause illness in animals can spread to humans. Where did the coronavirus come from? In December 2019, [place] told the World Health Organization (WHO) of several cases of lung disease (human respiratory illness). These cases were linked to an open seafood and livestock market in the city of [place]. The link to the seafood and livestock market suggests that the virus may have spread from animals to humans. However, since that first outbreak in December, the virus has also been shown to spread from person to person. What is the name of the disease and the virus? Disease name Early on, this disease was called novel coronavirus. This is because scientists determined that the disease was caused by a new (novel) respiratory virus. The World Health Organization (WHO) has now named the disease COVID-19, or coronavirus disease. Virus name The virus that causes the disease is called severe acute respiratory syndrome coronavirus 2 (SARS-CoV-2). More information on disease and virus naming World Health Organization (WHO): www.who.int/emergencies/diseases/novel-coronavirus-2019/technical-guidance/naming-the-coronavirus-disease-(covid-2019)-and-the-virus-that-causes-it Who is at risk for complications from coronavirus disease? Some people may be at higher risk for complications from coronavirus disease. This includes older adults and people who have chronic diseases, such as heart disease, diabetes, and lung disease. If you are at higher risk for complications, take these extra precautions:  Stay home as much as possible.  Avoid social gatherings and travel.  Avoid close contact with others. Stay at least 6 ft (2 m) away from others, if possible.  Wash your hands often with soap and water for at least 20 seconds.  Avoid touching your face, mouth, nose, or eyes.    Keep supplies on hand at home, such as food, medicine, and cleaning supplies.  If you must go out in public, wear a cloth  face covering or face mask. Make sure your mask covers your nose and mouth. How does coronavirus disease spread? The virus that causes coronavirus disease spreads easily from person to person (is contagious). You may catch the virus by:  Breathing in droplets from an infected person. Droplets can be spread by a person breathing, speaking, singing, coughing, or sneezing.  Touching something, like a table or a doorknob, that was exposed to the virus (contaminated) and then touching your mouth, nose, or eyes. Can I get the virus from touching surfaces or objects? There is still a lot that we do not know about the virus that causes coronavirus disease. Scientists are basing a lot of information on what they know about similar viruses, such as:  Viruses cannot generally survive on surfaces for long. They need a human body (host) to survive.  It is more likely that the virus is spread by close contact with people who are sick (direct contact), such as through: ? Shaking hands or hugging. ? Breathing in respiratory droplets that travel through the air. Droplets can be spread by a person breathing, speaking, singing, coughing, or sneezing.  It is less likely that the virus is spread when a person touches a surface or object that has the virus on it (indirect contact). The virus may be able to enter the body if the person touches a surface or object and then touches his or her face, eyes, nose, or mouth. Can a person spread the virus without having symptoms of the disease? It may be possible for the virus to spread before a person has symptoms of the disease, but this is most likely not the main way the virus is spreading. It is more likely for the virus to spread by being in close contact with people who are sick and breathing in the respiratory droplets spread by a person breathing, speaking, singing, coughing, or sneezing. What are the symptoms of coronavirus disease? Symptoms vary from person to  person and can range from mild to severe. Symptoms may include:  Fever or chills.  Cough.  Difficulty breathing or feeling short of breath.  Headaches, body aches, or muscle aches.  Runny or stuffy (congested) nose.  Sore throat.  New loss of taste or smell.  Nausea, vomiting, or diarrhea. These symptoms can appear anywhere from 2 to 14 days after you have been exposed to the virus. Some people may not have any symptoms. If you develop symptoms, call your health care provider. People with severe symptoms may need hospital care. Should I be tested for this virus? Your health care provider will decide whether to test you based on your symptoms, history of exposure, and your risk factors. How does a health care provider test for this virus? Health care providers will collect samples to send for testing. Samples may include:  Taking a swab of fluid from the back of your nose and throat, your nose, or your throat.  Taking fluid from the lungs by having you cough up mucus (sputum) into a sterile cup.  Taking a blood sample. Is there a treatment or vaccine for this virus? Currently, there is no vaccine to prevent coronavirus disease. Also, there are no medicines like antibiotics or antivirals to treat the virus. A person who becomes sick is given supportive care, which means rest and fluids. A person may also   relieve his or her symptoms by using over-the-counter medicines that treat sneezing, coughing, and runny nose. These are the same medicines that a person takes for the common cold. If you develop symptoms, call your health care provider. People with severe symptoms may need hospital care. What can I do to protect myself and my family from this virus?     You can protect yourself and your family by taking the same actions that you would take to prevent the spread of other viruses. Take the following actions:  Wash your hands often with soap and water for at least 20 seconds. If soap  and water are not available, use alcohol-based hand sanitizer.  Avoid touching your face, mouth, nose, or eyes.  Cough or sneeze into a tissue, sleeve, or elbow. Do not cough or sneeze into your hand or the air. ? If you cough or sneeze into a tissue, throw it away immediately and wash your hands.  Disinfect objects and surfaces that you frequently touch every day.  Stay away from people who are sick.  Avoid going out in public, follow guidance from your state and local health authorities.  Avoid crowded indoor spaces. Stay at least 6 ft (2 m) away from others.  If you must go out in public, wear a cloth face covering or face mask. Make sure your mask covers your nose and mouth.  Stay home if you are sick, except to get medical care. Call your health care provider before you get medical care. Your health care provider will tell you how long to stay home.  Make sure your vaccines are up to date. Ask your health care provider what vaccines you need. What should I do if I need to travel? Follow travel recommendations from your local health authority, the CDC, and WHO. Travel information and advice  Centers for Disease Control and Prevention (CDC): www.cdc.gov/coronavirus/2019-ncov/travelers/index.html  World Health Organization (WHO): www.who.int/emergencies/diseases/novel-coronavirus-2019/travel-advice Know the risks and take action to protect your health  You are at higher risk of getting coronavirus disease if you are traveling to areas with an outbreak or if you are exposed to travelers from areas with an outbreak.  Wash your hands often and practice good hygiene to lower the risk of catching or spreading the virus. What should I do if I am sick? General instructions to stop the spread of infection  Wash your hands often with soap and water for at least 20 seconds. If soap and water are not available, use alcohol-based hand sanitizer.  Cough or sneeze into a tissue, sleeve, or  elbow. Do not cough or sneeze into your hand or the air.  If you cough or sneeze into a tissue, throw it away immediately and wash your hands.  Stay home unless you must get medical care. Call your health care provider or local health authority before you get medical care.  Avoid public areas. Do not take public transportation, if possible.  If you can, wear a mask if you must go out of the house or if you are in close contact with someone who is not sick. Make sure your mask covers your nose and mouth. Keep your home clean  Disinfect objects and surfaces that are frequently touched every day. This may include: ? Counters and tables. ? Doorknobs and light switches. ? Sinks and faucets. ? Electronics such as phones, remote controls, keyboards, computers, and tablets.  Wash dishes in hot, soapy water or use a dishwasher. Air-dry your dishes.  Wash laundry in   hot water. Prevent infecting other household members  Let healthy household members care for children and pets, if possible. If you have to care for children or pets, wash your hands often and wear a mask.  Sleep in a different bedroom or bed, if possible.  Do not share personal items, such as razors, toothbrushes, deodorant, combs, brushes, towels, and washcloths. Where to find more information Centers for Disease Control and Prevention (CDC)  Information and news updates: www.cdc.gov/coronavirus/2019-ncov World Health Organization (WHO)  Information and news updates: www.who.int/emergencies/diseases/novel-coronavirus-2019  Coronavirus health topic: www.who.int/health-topics/coronavirus  Questions and answers on COVID-19: www.who.int/news-room/q-a-detail/q-a-coronaviruses  Global tracker: who.sprinklr.com American Academy of Pediatrics (AAP)  Information for families: www.healthychildren.org/English/health-issues/conditions/chest-lungs/Pages/2019-Novel-Coronavirus.aspx The coronavirus situation is changing rapidly. Check  your local health authority website or the CDC and WHO websites for updates and news. When should I contact a health care provider?  Contact your health care provider if you have symptoms of an infection, such as fever or cough, and you: ? Have been near anyone who is known to have coronavirus disease. ? Have come into contact with a person who is suspected to have coronavirus disease. ? Have traveled to an area where there is an outbreak of COVID-19. When should I get emergency medical care?  Get help right away by calling your local emergency services (911 in the [place] if you have: ? Trouble breathing. ? Pain or pressure in your chest. ? Confusion. ? Blue-tinged lips and fingernails. ? Difficulty waking from sleep. ? Symptoms that get worse. Let the emergency medical personnel know if you think you have coronavirus disease. Summary  A new respiratory virus is spreading from person to person and causing COVID-19 (coronavirus disease).  The virus that causes COVID-19 appears to spread easily. It spreads from one person to another through droplets from breathing, speaking, singing, coughing, or sneezing.  Older adults and those with chronic diseases are at higher risk of disease. If you are at higher risk for complications, take extra precautions.  There is currently no vaccine to prevent coronavirus disease. There are no medicines, such as antibiotics or antivirals, to treat the virus.  You can protect yourself and your family by washing your hands often, avoiding touching your face, and covering your coughs and sneezes. This information is not intended to replace advice given to you by your health care provider. Make sure you discuss any questions you have with your health care provider. Document Revised: 02/02/2019 Document Reviewed: 08/01/2018 Elsevier Patient Education  2020 Elsevier Inc.  

## 2020-04-03 NOTE — MAU Note (Signed)
Pt was dx with covid 18 days ago. Was dx with covid pneumonia 12/04. Was given monoclonal antibodies and IVFs. Had xray today and chest xray worse today than when dx with covid pneumonia. Having SOB today. NO pregnancy concerns

## 2020-04-03 NOTE — MAU Provider Note (Signed)
Chief Complaint:  abnormal labs and Shortness of Breath   Seen by provider at 0010hrs.    HPI: Tina Ramirez is a 44 y.o. [phone] at 76w5dwho presents to maternity admissions reporting worsening pneumonia related to Covid.  Was seen by her primary doctor for worsening shortness of breath.  He did labs and an xray, which showed worsened multilobar pneumonia.  She was sent to ED, but they had such a large census, it was going to be a while before she was seen, so I instructed them to send her here.. She reports good fetal movement, denies LOF, vaginal bleeding, vaginal itching/burning, urinary symptoms, h/a, dizziness, n/v, diarrhea, constipation or fever/chills.    She was originally diagnosed on 03/20/20 and was given Monoclonal Antibodies 03/22/20  Shortness of Breath This is a recurrent problem. The current episode started 1 to 4 weeks ago. The problem occurs constantly. The problem has been unchanged. Associated symptoms include leg swelling, rhinorrhea and sputum production. Pertinent negatives include no abdominal pain, ear pain, headaches, leg pain, orthopnea, syncope, vomiting or wheezing. Risk factors: Pregnancy, covid.    RN Note: Pt was dx with covid 18 days ago. Was dx with covid pneumonia 12/04. Was given monoclonal antibodies and IVFs. Had xray today and chest xray worse today than when dx with covid pneumonia. Having SOB today. NO pregnancy concerns   Past Medical History: Past Medical History:  Diagnosis Date  . AMA (advanced maternal age) multigravida 35+   . Celiac disease    pt says was a misdiagnosis  . Depression   . HSV (herpes simplex virus) anogenital infection   . IBS (irritable bowel syndrome)   . Vaginal Pap smear, abnormal     Past obstetric history: OB History  Gravida Para Term Preterm AB Living  5 4 1 3   2   SAB IAB Ectopic Multiple Live Births        0 2    # Outcome Date GA Lbr Len/2nd Weight Sex Delivery Anes PTL Lv  5 Current           4 Term  02/03/16 [redacted]w[redacted]d  3585 g M CS-LTranv Spinal  LIV  3 Preterm 2003 [redacted]w[redacted]d  2892 g M CS-LTranv   LIV  2 Preterm 1999 [redacted]w[redacted]d    CS-LTranv   FD     Birth Comments: non reassuring fhr, maternal fetal bleed  1 Preterm         FD    Past Surgical History: Past Surgical History:  Procedure Laterality Date  . ADENOIDECTOMY    . CESAREAN SECTION     x 2  . CESAREAN SECTION N/A 02/03/2016   Procedure: CESAREAN SECTION;  Surgeon: 02/05/2016, MD;  Location: [facility] BIRTHING SUITES;  Service: Obstetrics;  Laterality: N/A;  Repeat edc 02/09/16 NKDA Needs RNFA  . cold knife conization  2007  . ESOPHAGOGASTRODUODENOSCOPY  10/06/2011   Procedure: ESOPHAGOGASTRODUODENOSCOPY (EGD);  Surgeon: 10/08/2011, MD;  Location: [facility] ENDO SU[facility];  Service: Endoscopy;  Laterality: N/A;  310  . HAND SURGERY     right x 2   . MOUTH SURGERY      Family History: Family History  Problem Relation Age of Onset  . Asthma Son   . Transient ischemic attack Mother     Social History: Social History   Tobacco Use  . Smoking status: Never Smoker  . Smokeless tobacco: Never Used  Vaping Use  . Vaping Use: Never used  Substance Use Topics  . Alcohol  use: Not Currently  . Drug use: No    Allergies: No Known Allergies  Meds:  Medications Prior to Admission  Medication Sig Dispense Refill Last Dose  . acetaminophen (TYLENOL) 325 MG tablet Take 650 mg by mouth every 6 (six) hours as needed.   04/02/2020 at 1400  . dextromethorphan (DELSYM) 30 MG/5ML liquid Take by mouth as needed for cough.   04/02/2020 at Unknown time  . hydroxyprogesterone caproate (MAKENA) 250 mg/mL OIL injection Inject into the muscle.   Past Week at Unknown time  . albuterol (VENTOLIN HFA) 108 (90 Base) MCG/ACT inhaler Inhale 2 puffs into the lungs every 4 (four) hours as needed for wheezing or shortness of breath. 8 g 0   . valACYclovir (VALTREX) 500 MG tablet TAKE ONE (1) TABLET BY MOUTH EVERY DAY 30 tablet 5     I have reviewed patient's Past  Medical Hx, Surgical Hx, Family Hx, Social Hx, medications and allergies.   ROS:  Review of Systems  HENT: Positive for rhinorrhea. Negative for ear pain.   Respiratory: Positive for sputum production and shortness of breath. Negative for wheezing.   Cardiovascular: Positive for leg swelling. Negative for orthopnea and syncope.  Gastrointestinal: Negative for abdominal pain and vomiting.  Neurological: Negative for headaches.   Other systems negative  Physical Exam   Patient Vitals for the past 24 hrs:  BP Temp Temp src Pulse Resp SpO2 Height Weight  04/03/20 0014 -- (!) 97 F (36.1 C) -- -- 20 97 % -- --  04/02/20 2335 125/76 -- -- 87 16 100 % -- --  04/02/20 2054 -- -- -- -- -- -- 5\' 1"  (1.549 m) 58.5 kg  04/02/20 2043 133/84 99.4 F (37.4 C) Oral (!) 102 18 97 % -- --   Constitutional: Well-developed, well-nourished female in no acute distress.  Cardiovascular: normal rate and rhythm Respiratory: Does not appear dyspneic, normal effort, Oxygen saturation 98-100% both lying in bed and walking at bedside GI: Abd soft, non-tender, gravid appropriate for gestational age.   No rebound or guarding. MS: Extremities nontender, no edema, normal ROM Neurologic: Alert and oriented x 4.  GU: Neg CVAT.  PELVIC EXAM: deferred  FHT:  Baseline 120s , moderate variability, large accelerations present, no decelerations Contractions: occasional    Labs: Results for orders placed or performed during the hospital encounter of 04/02/20 (from the past 24 hour(s))  Comprehensive metabolic panel     Status: Abnormal   Collection Time: 04/02/20  9:01 PM  Result Value Ref Range   Sodium 138 135 - 145 mmol/L   Potassium 3.0 (L) 3.5 - 5.1 mmol/L   Chloride 103 98 - 111 mmol/L   CO2 24 22 - 32 mmol/L   Glucose, Bld 80 70 - 99 mg/dL   BUN 6 6 - 20 mg/dL   Creatinine, Ser 04/04/20 0.44 - 1.00 mg/dL   Calcium 8.1 (L) 8.9 - 10.3 mg/dL   Total Protein 5.3 (L) 6.5 - 8.1 g/dL   Albumin 2.1 (L) 3.5 - 5.0  g/dL   AST 17 15 - 41 U/L   ALT 10 0 - 44 U/L   Alkaline Phosphatase 61 38 - 126 U/L   Total Bilirubin 0.4 0.3 - 1.2 mg/dL   GFR, Estimated 8.36 >62 mL/min   Anion gap 11 5 - 15  CBC     Status: Abnormal   Collection Time: 04/02/20  9:01 PM  Result Value Ref Range   WBC 6.8 4.0 - 10.5 K/uL  RBC 2.22 (L) 3.87 - 5.11 MIL/uL   Hemoglobin 7.1 (L) 12.0 - 15.0 g/dL   HCT 32.3 (L) 55.7 - 32.2 %   MCV 98.2 80.0 - 100.0 fL   MCH 32.0 26.0 - 34.0 pg   MCHC 32.6 30.0 - 36.0 g/dL   RDW 02.5 42.7 - 06.2 %   Platelets 321 150 - 400 K/uL   nRBC 0.0 0.0 - 0.2 %    Imaging:  DG Chest 2 View  Result Date: 04/02/2020 CLINICAL DATA:  Cough, shortness of breath, COVID, dyspnea on exertion EXAM: CHEST - 2 VIEW COMPARISON:  03/22/2020 FINDINGS: Subpleural patchy opacities in the lungs bilaterally, lower lobe predominant with relative sparing of the right upper lobe, progressive. No pleural effusion or pneumothorax. The heart is normal in size. Visualized osseous structures are within normal limits. IMPRESSION: Progressive multifocal pneumonia in this patient with known COVID. Electronically Signed   By: Charline Bills M.D.   On: 04/02/2020 16:59   DG Chest Port 1 View  Result Date: 03/22/2020 CLINICAL DATA:  COVID-19 EXAM: PORTABLE CHEST 1 VIEW COMPARISON:  None. FINDINGS: Patchy basilar and peripheral predominant opacities are present in the lungs. No pneumothorax or effusion. The cardiomediastinal contours are unremarkable. No acute osseous or soft tissue abnormality. IMPRESSION: Patchy basilar and peripheral predominant opacities in the lungs compatible with multifocal pneumonia in the setting of COVID-19. Electronically Signed   By: Kreg Shropshire M.D.   On: 03/22/2020 19:16    MAU Course/MDM: I have reviewed notes and results ordered earlier.  There is mild hypokalemia, but all other labs WNL except for low hemoglobin.  Her baseline from 2017-early December 2021 was 8.6   It is now in the low 7s.    No leukocytosis NST reviewed, reactive, even at this gestational age Consult Dr Alysia Penna and Dr Rana Snare with presentation, exam findings and test results.  They do not recommend doing CT Angiogram, since she is not tachycardic or hypoxic, low suspicion for PE given risks of radiation.  They do recommend giving her Netta Cedars, as increasing her hemoglobin would improve her oxygen-carrying capacity.  Dr Alysia Penna recommends baby ASA I also recommend adding Mucinex for mobilization of secretions;   Tolerated Fereheme well   Assessment: Single IUP at [redacted]w[redacted]d Covid infection x 2 weeks Covid pneumonia Anemia  Plan: Discharge home Rx Mucinex for pneumonia Deep breathing, keep moving, covid precautions.  Preterm Labor precautions and fetal kick counts Follow up in Office for prenatal visits and Glucola/Makena tomorrow as scheduled  Encouraged to return if she develops worsening of symptoms, increase in pain, fever, or other concerning symptoms.   Pt stable at time of discharge.  Wynelle Bourgeois CNM, MSN Certified Nurse-Midwife 04/03/2020 2:06 AM

## 2020-04-06 DIAGNOSIS — J1282 Pneumonia due to coronavirus disease 2019: Secondary | ICD-10-CM | POA: Insufficient documentation

## 2020-04-06 DIAGNOSIS — U071 COVID-19: Secondary | ICD-10-CM | POA: Insufficient documentation

## 2020-04-24 ENCOUNTER — Other Ambulatory Visit (HOSPITAL_COMMUNITY): Payer: Self-pay | Admitting: *Deleted

## 2020-04-25 ENCOUNTER — Other Ambulatory Visit: Payer: Self-pay

## 2020-04-25 ENCOUNTER — Ambulatory Visit (HOSPITAL_COMMUNITY)
Admission: RE | Admit: 2020-04-25 | Discharge: 2020-04-25 | Disposition: A | Discharge status: Home / Self Care | Payer: Managed Care, Other (non HMO) | Source: Ambulatory Visit | Attending: Obstetrics and Gynecology | Admitting: Obstetrics and Gynecology

## 2020-04-25 DIAGNOSIS — Z3A00 Weeks of gestation of pregnancy not specified: Secondary | ICD-10-CM | POA: Diagnosis not present

## 2020-04-25 DIAGNOSIS — D649 Anemia, unspecified: Secondary | ICD-10-CM | POA: Diagnosis not present

## 2020-04-25 DIAGNOSIS — O99012 Anemia complicating pregnancy, second trimester: Secondary | ICD-10-CM | POA: Diagnosis not present

## 2020-04-25 MED ORDER — SODIUM CHLORIDE 0.9 % IV SOLN
510.0000 mg | Freq: Once | INTRAVENOUS | Status: AC
  Administered 2020-04-25: 510 mg via INTRAVENOUS
  Filled 2020-04-25: qty 510

## 2020-05-28 ENCOUNTER — Encounter (HOSPITAL_COMMUNITY): Payer: Self-pay | Admitting: Obstetrics and Gynecology

## 2020-05-28 ENCOUNTER — Other Ambulatory Visit: Payer: Self-pay

## 2020-05-28 ENCOUNTER — Encounter (HOSPITAL_COMMUNITY)
Admission: AD | Disposition: A | Discharge status: Home / Self Care | Payer: Self-pay | Source: Home / Self Care | Attending: Obstetrics and Gynecology

## 2020-05-28 ENCOUNTER — Inpatient Hospital Stay (HOSPITAL_COMMUNITY)
Admission: AD | Admit: 2020-05-28 | Discharge: 2020-06-01 | DRG: 784 | Disposition: A | Discharge status: Home / Self Care | Payer: Managed Care, Other (non HMO) | Attending: Obstetrics and Gynecology | Admitting: Obstetrics and Gynecology

## 2020-05-28 DIAGNOSIS — Z3A35 35 weeks gestation of pregnancy: Secondary | ICD-10-CM | POA: Diagnosis not present

## 2020-05-28 DIAGNOSIS — O9832 Other infections with a predominantly sexual mode of transmission complicating childbirth: Secondary | ICD-10-CM | POA: Diagnosis present

## 2020-05-28 DIAGNOSIS — Z8616 Personal history of COVID-19: Secondary | ICD-10-CM

## 2020-05-28 DIAGNOSIS — O9081 Anemia of the puerperium: Secondary | ICD-10-CM | POA: Diagnosis not present

## 2020-05-28 DIAGNOSIS — D62 Acute posthemorrhagic anemia: Secondary | ICD-10-CM | POA: Diagnosis not present

## 2020-05-28 DIAGNOSIS — O42913 Preterm premature rupture of membranes, unspecified as to length of time between rupture and onset of labor, third trimester: Secondary | ICD-10-CM | POA: Diagnosis not present

## 2020-05-28 DIAGNOSIS — O42013 Preterm premature rupture of membranes, onset of labor within 24 hours of rupture, third trimester: Secondary | ICD-10-CM

## 2020-05-28 DIAGNOSIS — O34211 Maternal care for low transverse scar from previous cesarean delivery: Secondary | ICD-10-CM | POA: Diagnosis present

## 2020-05-28 DIAGNOSIS — Z98891 History of uterine scar from previous surgery: Secondary | ICD-10-CM

## 2020-05-28 DIAGNOSIS — A6000 Herpesviral infection of urogenital system, unspecified: Secondary | ICD-10-CM | POA: Diagnosis present

## 2020-05-28 HISTORY — PX: OOPHORECTOMY: SHX6387

## 2020-05-28 SURGERY — Surgical Case
Anesthesia: Spinal

## 2020-05-28 MED ORDER — SOD CITRATE-CITRIC ACID 500-334 MG/5ML PO SOLN
30.0000 mL | Freq: Once | ORAL | Status: AC
  Administered 2020-05-29: 30 mL via ORAL
  Filled 2020-05-28: qty 15

## 2020-05-28 MED ORDER — FAMOTIDINE IN NACL 20-0.9 MG/50ML-% IV SOLN
20.0000 mg | Freq: Once | INTRAVENOUS | Status: AC
  Administered 2020-05-28: 20 mg via INTRAVENOUS
  Filled 2020-05-28: qty 50

## 2020-05-28 MED ORDER — LACTATED RINGERS IV SOLN: INTRAVENOUS | Status: DC

## 2020-05-28 MED ORDER — LACTATED RINGERS IV BOLUS
1000.0000 mL | Freq: Once | INTRAVENOUS | Status: AC
  Administered 2020-05-28: 1000 mL via INTRAVENOUS

## 2020-05-28 MED ORDER — BETAMETHASONE SOD PHOS & ACET 6 (3-3) MG/ML IJ SUSP
12.0000 mg | Freq: Once | INTRAMUSCULAR | Status: AC
  Administered 2020-05-28: 12 mg via INTRAMUSCULAR
  Filled 2020-05-28: qty 5

## 2020-05-28 MED ORDER — CEFAZOLIN SODIUM-DEXTROSE 2-4 GM/100ML-% IV SOLN
2.0000 g | INTRAVENOUS | Status: AC
  Administered 2020-05-29 (×2): 2 g via INTRAVENOUS
  Filled 2020-05-28: qty 100

## 2020-05-28 SURGICAL SUPPLY — 39 items
APL SKNCLS STERI-STRIP NONHPOA (GAUZE/BANDAGES/DRESSINGS) ×2
BENZOIN TINCTURE PRP APPL 2/3 (GAUZE/BANDAGES/DRESSINGS) ×1 IMPLANT
CHLORAPREP W/TINT 26ML (MISCELLANEOUS) ×3 IMPLANT
CLAMP CORD UMBIL (MISCELLANEOUS) IMPLANT
CLOSURE STERI STRIP 1/2 X4 (GAUZE/BANDAGES/DRESSINGS) ×2 IMPLANT
CLOTH BEACON ORANGE TIMEOUT ST (SAFETY) ×3 IMPLANT
DRSG OPSITE POSTOP 4X10 (GAUZE/BANDAGES/DRESSINGS) ×3 IMPLANT
ELECT REM PT RETURN 9FT ADLT (ELECTROSURGICAL) ×3
ELECTRODE REM PT RTRN 9FT ADLT (ELECTROSURGICAL) ×2 IMPLANT
EXTRACTOR VACUUM M CUP 4 TUBE (SUCTIONS) IMPLANT
GLOVE BIOGEL PI IND STRL 7.0 (GLOVE) ×2 IMPLANT
GLOVE BIOGEL PI INDICATOR 7.0 (GLOVE) ×1
GLOVE SURG ORTHO 8.0 STRL STRW (GLOVE) ×3 IMPLANT
GOWN STRL REUS W/TWL LRG LVL3 (GOWN DISPOSABLE) ×6 IMPLANT
HEMOSTAT ARISTA ABSORB 3G PWDR (HEMOSTASIS) ×2 IMPLANT
KIT ABG SYR 3ML LUER SLIP (SYRINGE) ×3 IMPLANT
NDL HYPO 25X5/8 SAFETYGLIDE (NEEDLE) ×1 IMPLANT
NEEDLE HYPO 25X5/8 SAFETYGLIDE (NEEDLE) ×3 IMPLANT
NS IRRIG 1000ML POUR BTL (IV SOLUTION) ×3 IMPLANT
PACK C SECTION WH (CUSTOM PROCEDURE TRAY) ×3 IMPLANT
PAD ABD 7.5X8 STRL (GAUZE/BANDAGES/DRESSINGS) ×4 IMPLANT
PAD OB MATERNITY 4.3X12.25 (PERSONAL CARE ITEMS) ×3 IMPLANT
PENCIL SMOKE EVAC W/HOLSTER (ELECTROSURGICAL) ×3 IMPLANT
SPONGE GAUZE 4X4 12PLY STER LF (GAUZE/BANDAGES/DRESSINGS) ×2 IMPLANT
SPONGE LAP 18X18 X RAY DECT (DISPOSABLE) ×24 IMPLANT
STAPLER VISISTAT 35W (STAPLE) ×2 IMPLANT
SUT CHROMIC 2 0 SH (SUTURE) ×8 IMPLANT
SUT MNCRL 0 VIOLET CTX 36 (SUTURE) ×11 IMPLANT
SUT MON AB 4-0 PS1 27 (SUTURE) ×3 IMPLANT
SUT MON AB-0 CT1 36 (SUTURE) ×2 IMPLANT
SUT MONOCRYL 0 CTX 36 (SUTURE) ×24
SUT PDS AB 1 CT  36 (SUTURE)
SUT PDS AB 1 CT 36 (SUTURE) IMPLANT
SUT VIC AB 1 CTX 36 (SUTURE)
SUT VIC AB 1 CTX36XBRD ANBCTRL (SUTURE) IMPLANT
TOWEL OR 17X24 6PK STRL BLUE (TOWEL DISPOSABLE) ×3 IMPLANT
TRAY FOLEY W/BAG SLVR 14FR LF (SET/KITS/TRAYS/PACK) ×3 IMPLANT
WATER STERILE IRR 1000ML POUR (IV SOLUTION) ×5 IMPLANT
YANKAUER SUCT BULB TIP NO VENT (SUCTIONS) ×1 IMPLANT

## 2020-05-28 NOTE — MAU Provider Note (Signed)
Chief Complaint:  Vaginal Discharge   Event Date/Time   First Provider Initiated Contact with Patient 05/28/20 2204     HPI: Tina Ramirez is a 45 y.o. [id number] at 73w4dwho presents to maternity admissions reporting leaking fluid for the past 2 hours. . She reports good fetal movement, denies vaginal bleeding, vaginal itching/burning, urinary symptoms, h/a, dizziness, n/v, diarrhea, constipation or fever/chills.    Vaginal Discharge The patient's primary symptoms include vaginal discharge. The patient's pertinent negatives include no genital itching, genital lesions, genital odor, pelvic pain or vaginal bleeding. The problem has been unchanged. The patient is experiencing no pain. Pertinent negatives include no abdominal pain, chills, constipation, diarrhea, fever, nausea or vomiting. The vaginal discharge was clear and watery. There has been no bleeding.    RN Note: Leaking clear fld for couple hours. I have braxton hicks all the time so nothing new with ctxs.  Past Medical History: Past Medical History:  Diagnosis Date  . AMA (advanced maternal age) multigravida 35+   . Celiac disease    pt says was a misdiagnosis  . Depression   . HSV (herpes simplex virus) anogenital infection   . IBS (irritable bowel syndrome)   . Vaginal Pap smear, abnormal     Past obstetric history: OB History  Gravida Para Term Preterm AB Living  5 3 1 2 1 2   SAB IAB Ectopic Multiple Live Births  1     0 2    # Outcome Date GA Lbr Len/2nd Weight Sex Delivery Anes PTL Lv  5 Current           4 Term 02/03/16 [redacted]w[redacted]d  3585 g M CS-LTranv Spinal  LIV  3 SAB 04/26/07 [redacted]w[redacted]d       FD  2 Preterm 2003 [redacted]w[redacted]d  2892 g M CS-LTranv   LIV  1 Preterm 1999 [redacted]w[redacted]d    CS-LTranv   FD     Birth Comments: non reassuring fhr, maternal fetal bleed    Past Surgical History: Past Surgical History:  Procedure Laterality Date  . ADENOIDECTOMY    . CESAREAN SECTION     x 2  . CESAREAN SECTION N/A 02/03/2016   Procedure:  CESAREAN SECTION;  Surgeon: 02/05/2016, MD;  Location: [facility] BIRTHING SUITES;  Service: Obstetrics;  Laterality: N/A;  Repeat edc 02/09/16 NKDA Needs RNFA  . cold knife conization  2007  . ESOPHAGOGASTRODUODENOSCOPY  10/06/2011   Procedure: ESOPHAGOGASTRODUODENOSCOPY (EGD);  Surgeon: 10/08/2011, MD;  Location: [facility];  Service: Endoscopy;  Laterality: N/A;  310  . HAND SURGERY     right x 2   . MOUTH SURGERY      Family History: Family History  Problem Relation Age of Onset  . Asthma Son   . Transient ischemic attack Mother     Social History: Social History   Tobacco Use  . Smoking status: Never Smoker  . Smokeless tobacco: Never Used  Vaping Use  . Vaping Use: Never used  Substance Use Topics  . Alcohol use: Not Currently  . Drug use: No    Allergies: No Known Allergies  Meds:  Medications Prior to Admission  Medication Sig Dispense Refill Last Dose  . acetaminophen (TYLENOL) 325 MG tablet Take 650 mg by mouth every 6 (six) hours as needed.     Malissa Hippo albuterol (VENTOLIN HFA) 108 (90 Base) MCG/ACT inhaler Inhale 2 puffs into the lungs every 4 (four) hours as needed for wheezing or shortness of breath. 8 g 0   .  dextromethorphan (DELSYM) 30 MG/5ML liquid Take by mouth as needed for cough.     Marland Kitchen guaiFENesin (MUCINEX) 600 MG 12 hr tablet Take 1 tablet (600 mg total) by mouth 2 (two) times daily. 30 tablet 2   . hydroxyprogesterone caproate (MAKENA) 250 mg/mL OIL injection Inject into the muscle.     . valACYclovir (VALTREX) 500 MG tablet TAKE ONE (1) TABLET BY MOUTH EVERY DAY 30 tablet 5     I have reviewed patient's Past Medical Hx, Surgical Hx, Family Hx, Social Hx, medications and allergies.   ROS:  Review of Systems  Constitutional: Negative for chills and fever.  Gastrointestinal: Negative for abdominal pain, constipation, diarrhea, nausea and vomiting.  Genitourinary: Positive for vaginal discharge. Negative for pelvic pain.   Other systems  negative  Physical Exam   Patient Vitals for the past 24 hrs:  BP Temp Pulse Resp Height Weight  05/28/20 2131 135/85 -- 85 -- -- --  05/28/20 2129 -- 98.9 F (37.2 C) -- 18 5\' 1"  (1.549 m) 59 kg   Constitutional: Well-developed, well-nourished female in no acute distress.  Cardiovascular: normal rate and rhythm Respiratory: normal effort, clear to auscultation bilaterally GI: Abd soft, non-tender, gravid appropriate for gestational age.   No rebound or guarding. MS: Extremities nontender, no edema, normal ROM Neurologic: Alert and oriented x 4.  GU: Neg CVAT.  PELVIC EXAM: Dilation: Fingertip Effacement (%): 80 Presentation: Vertex Exam by:: [email or web address], CNM SSE:   Gross pooling of clear fluid   FHT:  Baseline 140 , moderate variability, accelerations present, no decelerations Contractions:  Irregular    Labs: No results found for this or any previous visit (from the past 24 hour(s)).    Imaging:  No results found.  MAU Course/MDM: NST reviewed, reactive Consult Dr Wynelle Bourgeois with presentation, exam findings and test results.  Treatments in MAU included EFM  Assessment: SIUP at [redacted]w[redacted]d PPROM Prodromal contractions  Plan: Admit to OR for repeat C/S Dr [redacted]w[redacted]d to follow  Henderson Cloud CNM, MSN Certified Nurse-Midwife 05/28/2020 10:04 PM

## 2020-05-28 NOTE — H&P (Signed)
Tina Ramirez is a 45 y.o. female presenting for SROM about 7 pm. Now feeling UCs. Pregnancy complicated by previous C/S for repeat. Covid pneumonia December. OB History    Gravida  5   Para  3   Term  1   Preterm  2   AB  1   Living  2     SAB  1   IAB      Ectopic      Multiple  0   Live Births  2          Past Medical History:  Diagnosis Date  . AMA (advanced maternal age) multigravida 35+   . Celiac disease    pt says was a misdiagnosis  . Depression   . HSV (herpes simplex virus) anogenital infection   . IBS (irritable bowel syndrome)   . Vaginal Pap smear, abnormal    Past Surgical History:  Procedure Laterality Date  . ADENOIDECTOMY    . CESAREAN SECTION     x 2  . CESAREAN SECTION N/A 02/03/2016   Procedure: CESAREAN SECTION;  Surgeon: Candice Camp, MD;  Location: [facility] BIRTHING SUITES;  Service: Obstetrics;  Laterality: N/A;  Repeat edc 02/09/16 NKDA Needs RNFA  . cold knife conization  2007  . ESOPHAGOGASTRODUODENOSCOPY  10/06/2011   Procedure: ESOPHAGOGASTRODUODENOSCOPY (EGD);  Surgeon: Malissa Hippo, MD;  Location: [facility];  Service: Endoscopy;  Laterality: N/A;  310  . HAND SURGERY     right x 2   . MOUTH SURGERY     Family History: family history includes Asthma in her son; Transient ischemic attack in her mother. Social History:  reports that she has never smoked. She has never used smokeless tobacco. She reports previous alcohol use. She reports that she does not use drugs.     Maternal Diabetes: No Genetic Screening: Normal Maternal Ultrasounds/Referrals: Normal Fetal Ultrasounds or other Referrals:  None Maternal Substance Abuse:  No Significant Maternal Medications:  Valtrex Significant Maternal Lab Results:  Other:  unknown Other Comments:  None  Review of Systems  Constitutional: Negative for fever.  Eyes: Negative for visual disturbance.  Gastrointestinal: Negative for abdominal pain.  Neurological: Negative for  headaches.   Maternal Medical History:  Reason for admission: Rupture of membranes.   Fetal activity: Perceived fetal activity is normal.      Dilation: Fingertip Effacement (%): 80 Exam by:: Wynelle Bourgeois, CNM Blood pressure 135/85, pulse 85, temperature 98.9 F (37.2 C), resp. rate 18, height 5\' 1"  (1.549 m), weight 59 kg, last menstrual period 08/22/2019, unknown if currently breastfeeding. Maternal Exam:  Abdomen: Fetal presentation: vertex     Fetal Exam Fetal State Assessment: Category I - tracings are normal.     Physical Exam Cardiovascular:     Rate and Rhythm: Normal rate.  Pulmonary:     Effort: Pulmonary effort is normal.    SSE per NP>grossly ruptured, Cx FT/vtx/90% UCs Q2-3 min  Prenatal labs: ABO, Rh:   Antibody:   Rubella:   RPR:    HBsAg:    HIV:    GBS:     Assessment/Plan: 45 yo G5P2  PPROM Labor BMZ x 1 done D/W repeat C/S and risks including infection, organ damage, bleeding/transfusion-HIV/Hep, DVT/PE, pneumonia, wound break down.   45 II 05/28/2020, 11:37 PM

## 2020-05-28 NOTE — MAU Note (Signed)
Leaking clear fld for couple hours. I have braxton hicks all the time so nothing new with ctxs.

## 2020-05-29 ENCOUNTER — Inpatient Hospital Stay (HOSPITAL_COMMUNITY): Payer: Managed Care, Other (non HMO) | Admitting: Anesthesiology

## 2020-05-29 ENCOUNTER — Encounter (HOSPITAL_COMMUNITY): Payer: Self-pay | Admitting: Obstetrics and Gynecology

## 2020-05-29 DIAGNOSIS — O42913 Preterm premature rupture of membranes, unspecified as to length of time between rupture and onset of labor, third trimester: Secondary | ICD-10-CM | POA: Diagnosis present

## 2020-05-29 DIAGNOSIS — O34211 Maternal care for low transverse scar from previous cesarean delivery: Secondary | ICD-10-CM | POA: Diagnosis present

## 2020-05-29 DIAGNOSIS — O9081 Anemia of the puerperium: Secondary | ICD-10-CM | POA: Diagnosis not present

## 2020-05-29 DIAGNOSIS — D62 Acute posthemorrhagic anemia: Secondary | ICD-10-CM | POA: Diagnosis not present

## 2020-05-29 DIAGNOSIS — Z98891 History of uterine scar from previous surgery: Secondary | ICD-10-CM

## 2020-05-29 DIAGNOSIS — Z8616 Personal history of COVID-19: Secondary | ICD-10-CM | POA: Diagnosis not present

## 2020-05-29 DIAGNOSIS — A6000 Herpesviral infection of urogenital system, unspecified: Secondary | ICD-10-CM | POA: Diagnosis present

## 2020-05-29 DIAGNOSIS — Z3A35 35 weeks gestation of pregnancy: Secondary | ICD-10-CM | POA: Diagnosis not present

## 2020-05-29 DIAGNOSIS — O9832 Other infections with a predominantly sexual mode of transmission complicating childbirth: Secondary | ICD-10-CM | POA: Diagnosis present

## 2020-05-29 LAB — CBC
HCT: 25.1 % — ABNORMAL LOW (ref 36.0–46.0)
HCT: 25.3 % — ABNORMAL LOW (ref 36.0–46.0)
HCT: 31.7 % — ABNORMAL LOW (ref 36.0–46.0)
Hemoglobin: 10.5 g/dL — ABNORMAL LOW (ref 12.0–15.0)
Hemoglobin: 8.8 g/dL — ABNORMAL LOW (ref 12.0–15.0)
Hemoglobin: 8.9 g/dL — ABNORMAL LOW (ref 12.0–15.0)
MCH: 32.2 pg (ref 26.0–34.0)
MCH: 33.1 pg (ref 26.0–34.0)
MCH: 33.5 pg (ref 26.0–34.0)
MCHC: 33.1 g/dL (ref 30.0–36.0)
MCHC: 34.8 g/dL (ref 30.0–36.0)
MCHC: 35.5 g/dL (ref 30.0–36.0)
MCV: 101.3 fL — ABNORMAL HIGH (ref 80.0–100.0)
MCV: 92.7 fL (ref 80.0–100.0)
MCV: 93.3 fL (ref 80.0–100.0)
Platelets: 133 10*3/uL — ABNORMAL LOW (ref 150–400)
Platelets: 133 10*3/uL — ABNORMAL LOW (ref 150–400)
Platelets: 159 10*3/uL (ref 150–400)
RBC: 2.69 MIL/uL — ABNORMAL LOW (ref 3.87–5.11)
RBC: 2.73 MIL/uL — ABNORMAL LOW (ref 3.87–5.11)
RBC: 3.13 MIL/uL — ABNORMAL LOW (ref 3.87–5.11)
RDW: 14.8 % (ref 11.5–15.5)
RDW: 17 % — ABNORMAL HIGH (ref 11.5–15.5)
RDW: 17.2 % — ABNORMAL HIGH (ref 11.5–15.5)
WBC: 7.1 10*3/uL (ref 4.0–10.5)
WBC: 8 10*3/uL (ref 4.0–10.5)
WBC: 9.6 10*3/uL (ref 4.0–10.5)
nRBC: 0 % (ref 0.0–0.2)
nRBC: 0 % (ref 0.0–0.2)
nRBC: 0 % (ref 0.0–0.2)

## 2020-05-29 LAB — DIC (DISSEMINATED INTRAVASCULAR COAGULATION)PANEL
D-Dimer, Quant: >20 ug/mL-FEU — ABNORMAL HIGH (ref 0.00–0.50)
Fibrinogen: 192 mg/dL — ABNORMAL LOW (ref 210–475)
INR: 1.4 — ABNORMAL HIGH (ref 0.8–1.2)
Platelets: 160 10*3/uL (ref 150–400)
Prothrombin Time: 16.6 seconds — ABNORMAL HIGH (ref 11.4–15.2)
Smear Review: NONE SEEN
aPTT: 32 seconds (ref 24–36)

## 2020-05-29 LAB — RPR: RPR Ser Ql: NONREACTIVE

## 2020-05-29 LAB — PLATELET COUNT: Platelets: 154 10*3/uL (ref 150–400)

## 2020-05-29 LAB — ABO/RH: ABO/RH(D): A POS

## 2020-05-29 LAB — MASSIVE TRANSFUSION PROTOCOL ORDER (BLOOD BANK NOTIFICATION)

## 2020-05-29 LAB — SAVE SMEAR(SSMR), FOR PROVIDER SLIDE REVIEW

## 2020-05-29 MED ORDER — ALBUMIN HUMAN 5 % IV SOLN
INTRAVENOUS | Status: AC
  Filled 2020-05-29: qty 250

## 2020-05-29 MED ORDER — SENNOSIDES-DOCUSATE SODIUM 8.6-50 MG PO TABS
2.0000 | ORAL_TABLET | Freq: Every day | ORAL | Status: DC
  Administered 2020-05-30 – 2020-05-31 (×2): 2 via ORAL
  Filled 2020-05-29 (×2): qty 2

## 2020-05-29 MED ORDER — SIMETHICONE 80 MG PO CHEW
80.0000 mg | CHEWABLE_TABLET | Freq: Three times a day (TID) | ORAL | Status: DC
  Administered 2020-05-29 – 2020-06-01 (×10): 80 mg via ORAL
  Filled 2020-05-29 (×10): qty 1

## 2020-05-29 MED ORDER — ZOLPIDEM TARTRATE 5 MG PO TABS: 5.0000 mg | ORAL_TABLET | Freq: Every evening | ORAL | Status: DC | PRN

## 2020-05-29 MED ORDER — WITCH HAZEL-GLYCERIN EX PADS: 1.0000 "application " | MEDICATED_PAD | CUTANEOUS | Status: DC | PRN

## 2020-05-29 MED ORDER — LACTATED RINGERS IV BOLUS: 1000.0000 mL | Freq: Once | INTRAVENOUS | Status: DC

## 2020-05-29 MED ORDER — PHENYLEPHRINE HCL-NACL 20-0.9 MG/250ML-% IV SOLN
INTRAVENOUS | Status: AC
  Filled 2020-05-29: qty 250

## 2020-05-29 MED ORDER — HYDROMORPHONE HCL 1 MG/ML IJ SOLN
0.2500 mg | INTRAMUSCULAR | Status: DC | PRN
  Administered 2020-05-29: 0.5 mg via INTRAVENOUS

## 2020-05-29 MED ORDER — CEFAZOLIN SODIUM-DEXTROSE 2-4 GM/100ML-% IV SOLN
INTRAVENOUS | Status: AC
  Filled 2020-05-29: qty 100

## 2020-05-29 MED ORDER — FENTANYL CITRATE (PF) 100 MCG/2ML IJ SOLN
INTRAMUSCULAR | Status: AC
  Filled 2020-05-29: qty 2

## 2020-05-29 MED ORDER — TETANUS-DIPHTH-ACELL PERTUSSIS 5-2.5-18.5 LF-MCG/0.5 IM SUSY: 0.5000 mL | PREFILLED_SYRINGE | Freq: Once | INTRAMUSCULAR | Status: DC

## 2020-05-29 MED ORDER — LACTATED RINGERS IV SOLN: INTRAVENOUS | Status: DC

## 2020-05-29 MED ORDER — OXYTOCIN-SODIUM CHLORIDE 30-0.9 UT/500ML-% IV SOLN: 2.5000 [IU]/h | INTRAVENOUS | Status: AC

## 2020-05-29 MED ORDER — TRANEXAMIC ACID-NACL 1000-0.7 MG/100ML-% IV SOLN
INTRAVENOUS | Status: DC | PRN
  Administered 2020-05-29: 1000 mg via INTRAVENOUS

## 2020-05-29 MED ORDER — SODIUM CHLORIDE 0.9 % IV SOLN: INTRAVENOUS | Status: DC | PRN

## 2020-05-29 MED ORDER — ACETAMINOPHEN 10 MG/ML IV SOLN
INTRAVENOUS | Status: AC
  Filled 2020-05-29: qty 100

## 2020-05-29 MED ORDER — DEXMEDETOMIDINE (PRECEDEX) IN NS 20 MCG/5ML (4 MCG/ML) IV SYRINGE
PREFILLED_SYRINGE | INTRAVENOUS | Status: AC
  Filled 2020-05-29: qty 5

## 2020-05-29 MED ORDER — METHYLERGONOVINE MALEATE 0.2 MG/ML IJ SOLN
INTRAMUSCULAR | Status: AC
  Filled 2020-05-29: qty 1

## 2020-05-29 MED ORDER — PROPOFOL 10 MG/ML IV BOLUS
INTRAVENOUS | Status: AC
  Filled 2020-05-29: qty 20

## 2020-05-29 MED ORDER — DEXAMETHASONE SODIUM PHOSPHATE 4 MG/ML IJ SOLN
INTRAMUSCULAR | Status: DC | PRN
  Administered 2020-05-29: 4 mg via INTRAVENOUS

## 2020-05-29 MED ORDER — SIMETHICONE 80 MG PO CHEW
80.0000 mg | CHEWABLE_TABLET | ORAL | Status: DC | PRN
  Administered 2020-05-29: 80 mg via ORAL
  Filled 2020-05-29: qty 1

## 2020-05-29 MED ORDER — LACTATED RINGERS IV SOLN: INTRAVENOUS | Status: DC | PRN

## 2020-05-29 MED ORDER — MENTHOL 3 MG MT LOZG: 1.0000 | LOZENGE | OROMUCOSAL | Status: DC | PRN

## 2020-05-29 MED ORDER — KETOROLAC TROMETHAMINE 30 MG/ML IJ SOLN: 30.0000 mg | Freq: Once | INTRAMUSCULAR | Status: DC | PRN

## 2020-05-29 MED ORDER — ACETAMINOPHEN 500 MG PO TABS
1000.0000 mg | ORAL_TABLET | Freq: Four times a day (QID) | ORAL | Status: DC
  Administered 2020-05-29 – 2020-06-01 (×11): 1000 mg via ORAL
  Filled 2020-05-29 (×13): qty 2

## 2020-05-29 MED ORDER — OXYCODONE HCL 5 MG/5ML PO SOLN: 5.0000 mg | Freq: Once | ORAL | Status: DC | PRN

## 2020-05-29 MED ORDER — OXYTOCIN-SODIUM CHLORIDE 30-0.9 UT/500ML-% IV SOLN
INTRAVENOUS | Status: DC | PRN
  Administered 2020-05-29: 500 mL via INTRAVENOUS
  Administered 2020-05-29: 300 mL via INTRAVENOUS

## 2020-05-29 MED ORDER — FENTANYL CITRATE (PF) 100 MCG/2ML IJ SOLN
INTRAMUSCULAR | Status: DC | PRN
  Administered 2020-05-29 (×4): 50 ug via INTRAVENOUS

## 2020-05-29 MED ORDER — DEXMEDETOMIDINE (PRECEDEX) IN NS 20 MCG/5ML (4 MCG/ML) IV SYRINGE
PREFILLED_SYRINGE | INTRAVENOUS | Status: DC | PRN
  Administered 2020-05-29 (×2): 4 ug via INTRAVENOUS
  Administered 2020-05-29: 8 ug via INTRAVENOUS

## 2020-05-29 MED ORDER — PRENATAL MULTIVITAMIN CH
1.0000 | ORAL_TABLET | Freq: Every day | ORAL | Status: DC
  Administered 2020-05-29 – 2020-05-31 (×3): 1 via ORAL
  Filled 2020-05-29 (×3): qty 1

## 2020-05-29 MED ORDER — HYDROMORPHONE HCL 1 MG/ML IJ SOLN
INTRAMUSCULAR | Status: AC
  Filled 2020-05-29: qty 0.5

## 2020-05-29 MED ORDER — SUCCINYLCHOLINE CHLORIDE 200 MG/10ML IV SOSY
PREFILLED_SYRINGE | INTRAVENOUS | Status: DC | PRN
  Administered 2020-05-29: 100 mg via INTRAVENOUS

## 2020-05-29 MED ORDER — PHENYLEPHRINE HCL-NACL 20-0.9 MG/250ML-% IV SOLN
INTRAVENOUS | Status: DC | PRN
  Administered 2020-05-29: 45 ug/min via INTRAVENOUS

## 2020-05-29 MED ORDER — METHYLERGONOVINE MALEATE 0.2 MG/ML IJ SOLN
INTRAMUSCULAR | Status: DC | PRN
  Administered 2020-05-29: .2 mg via INTRAMUSCULAR

## 2020-05-29 MED ORDER — ALBUMIN HUMAN 5 % IV SOLN: INTRAVENOUS | Status: DC | PRN

## 2020-05-29 MED ORDER — OXYCODONE HCL 5 MG PO TABS
5.0000 mg | ORAL_TABLET | ORAL | Status: DC | PRN
  Administered 2020-05-29: 5 mg via ORAL
  Administered 2020-05-29: 10 mg via ORAL
  Administered 2020-05-30: 5 mg via ORAL
  Administered 2020-05-30 – 2020-06-01 (×11): 10 mg via ORAL
  Filled 2020-05-29 (×5): qty 2
  Filled 2020-05-29: qty 1
  Filled 2020-05-29: qty 2
  Filled 2020-05-29: qty 1
  Filled 2020-05-29 (×6): qty 2

## 2020-05-29 MED ORDER — TRANEXAMIC ACID-NACL 1000-0.7 MG/100ML-% IV SOLN
INTRAVENOUS | Status: AC
  Filled 2020-05-29: qty 100

## 2020-05-29 MED ORDER — DIBUCAINE (PERIANAL) 1 % EX OINT: 1.0000 "application " | TOPICAL_OINTMENT | CUTANEOUS | Status: DC | PRN

## 2020-05-29 MED ORDER — MIDAZOLAM HCL 2 MG/2ML IJ SOLN
INTRAMUSCULAR | Status: AC
  Filled 2020-05-29: qty 2

## 2020-05-29 MED ORDER — OXYCODONE HCL 5 MG PO TABS
5.0000 mg | ORAL_TABLET | Freq: Once | ORAL | Status: DC | PRN
Start: 2020-05-29 — End: 2020-05-29

## 2020-05-29 MED ORDER — ONDANSETRON HCL 4 MG/2ML IJ SOLN: 4.0000 mg | Freq: Once | INTRAMUSCULAR | Status: DC | PRN

## 2020-05-29 MED ORDER — ACETAMINOPHEN 10 MG/ML IV SOLN
INTRAVENOUS | Status: DC | PRN
  Administered 2020-05-29: 1000 mg via INTRAVENOUS

## 2020-05-29 MED ORDER — OXYTOCIN-SODIUM CHLORIDE 30-0.9 UT/500ML-% IV SOLN
INTRAVENOUS | Status: AC
  Filled 2020-05-29: qty 500

## 2020-05-29 MED ORDER — ALBUTEROL SULFATE (2.5 MG/3ML) 0.083% IN NEBU: 3.0000 mL | INHALATION_SOLUTION | RESPIRATORY_TRACT | Status: DC | PRN

## 2020-05-29 MED ORDER — COCONUT OIL OIL: 1.0000 "application " | TOPICAL_OIL | Status: DC | PRN

## 2020-05-29 MED ORDER — ONDANSETRON HCL 4 MG/2ML IJ SOLN
INTRAMUSCULAR | Status: AC
  Filled 2020-05-29: qty 2

## 2020-05-29 MED ORDER — ONDANSETRON HCL 4 MG/2ML IJ SOLN
INTRAMUSCULAR | Status: DC | PRN
  Administered 2020-05-29: 4 mg via INTRAVENOUS

## 2020-05-29 MED ORDER — SUCCINYLCHOLINE CHLORIDE 200 MG/10ML IV SOSY
PREFILLED_SYRINGE | INTRAVENOUS | Status: AC
  Filled 2020-05-29: qty 10

## 2020-05-29 MED ORDER — DEXAMETHASONE SODIUM PHOSPHATE 4 MG/ML IJ SOLN
INTRAMUSCULAR | Status: AC
  Filled 2020-05-29: qty 1

## 2020-05-29 MED ORDER — LIDOCAINE HCL (CARDIAC) PF 100 MG/5ML IV SOSY
PREFILLED_SYRINGE | INTRAVENOUS | Status: DC | PRN
  Administered 2020-05-29: 100 mg via INTRAVENOUS

## 2020-05-29 MED ORDER — MORPHINE SULFATE (PF) 0.5 MG/ML IJ SOLN
INTRAMUSCULAR | Status: DC | PRN
  Administered 2020-05-29: 150 ug via INTRATHECAL

## 2020-05-29 MED ORDER — POLYETHYLENE GLYCOL 3350 17 G PO PACK
17.0000 g | PACK | Freq: Every day | ORAL | Status: DC
  Administered 2020-05-30 – 2020-05-31 (×2): 17 g via ORAL
  Filled 2020-05-29 (×2): qty 1

## 2020-05-29 MED ORDER — HYDROMORPHONE HCL 1 MG/ML IJ SOLN: 0.2000 mg | INTRAMUSCULAR | Status: DC | PRN

## 2020-05-29 MED ORDER — KETOROLAC TROMETHAMINE 30 MG/ML IJ SOLN
INTRAMUSCULAR | Status: AC
  Filled 2020-05-29: qty 1

## 2020-05-29 MED ORDER — FENTANYL CITRATE (PF) 100 MCG/2ML IJ SOLN
INTRAMUSCULAR | Status: DC | PRN
  Administered 2020-05-29: 15 ug via INTRATHECAL

## 2020-05-29 MED ORDER — DIPHENHYDRAMINE HCL 25 MG PO CAPS: 25.0000 mg | ORAL_CAPSULE | Freq: Four times a day (QID) | ORAL | Status: DC | PRN

## 2020-05-29 MED ORDER — BUPIVACAINE IN DEXTROSE 0.75-8.25 % IT SOLN
INTRATHECAL | Status: DC | PRN
  Administered 2020-05-29: 12 mg via INTRATHECAL

## 2020-05-29 MED ORDER — PROPOFOL 10 MG/ML IV BOLUS
INTRAVENOUS | Status: DC | PRN
  Administered 2020-05-29: 150 mg via INTRAVENOUS

## 2020-05-29 MED ORDER — MORPHINE SULFATE (PF) 0.5 MG/ML IJ SOLN
INTRAMUSCULAR | Status: AC
  Filled 2020-05-29: qty 10

## 2020-05-29 MED ORDER — LIDOCAINE HCL (PF) 2 % IJ SOLN
INTRAMUSCULAR | Status: AC
  Filled 2020-05-29: qty 5

## 2020-05-29 NOTE — Anesthesia Procedure Notes (Signed)
Spinal  Patient location during procedure: OB Start time: 05/29/2020 1:04 AM End time: 05/29/2020 1:09 AM Staffing Performed: anesthesiologist  Anesthesiologist: Trevor Iha, MD Preanesthetic Checklist Completed: patient identified, IV checked, risks and benefits discussed, surgical consent, monitors and equipment checked, pre-op evaluation and timeout performed Spinal Block Patient position: sitting Prep: DuraPrep and site prepped and draped Patient monitoring: heart rate, cardiac monitor, continuous pulse ox and blood pressure Approach: midline Location: L3-4 Injection technique: single-shot Needle Needle type: Pencan  Needle gauge: 24 G Needle length: 10 cm Needle insertion depth: 4 cm Assessment Sensory level: T4 Additional Notes   1 Attempt (s). Pt tolerated procedure well.

## 2020-05-29 NOTE — Progress Notes (Signed)
Subjective: Postpartum Day 0: Cesarean Delivery, LSO Patient reports tolerating PO.  H/o constipation s/p last C/S; requests Miralax.  No dizziness.  Objective: Vital signs in last 24 hours: Temp:  [97.4 F (36.3 C)-98.9 F (37.2 C)] 98.2 F (36.8 C) (02/10 0745) Pulse Rate:  [71-95] 76 (02/10 0745) Resp:  [12-27] 18 (02/10 0745) BP: (72-135)/(45-85) 120/56 (02/10 0745) SpO2:  [94 %-100 %] 96 % (02/10 0745) Weight:  [59 kg] 59 kg (02/09 2129)  Physical Exam:  General: alert, cooperative and appears stated age Lochia: appropriate Uterine Fundus: firm Incision: dry pressure dressing in place DVT Evaluation: No evidence of DVT seen on physical exam. Negative Homan's sign.  Recent Labs    05/28/20 2345 05/29/20 0728  HGB 10.5* 8.9*  HCT 31.7* 25.1*    Assessment/Plan: Status post Cesarean section. Doing well postoperatively.  Acute blood loss anemia (total EBL 1500)-s/p 2u PRBCs and 2 FFP.  Stable hemoglobin.  Will recheck around noon. Miralax ordered Continue current care.  Mitchel Honour 05/29/2020, 10:59 AM

## 2020-05-29 NOTE — Brief Op Note (Signed)
05/29/2020  3:58 AM  PATIENT:  Floyce Stakes  45 y.o. female  PRE-OPERATIVE DIAGNOSIS:  PPROM, PREVIOUS X 2, Repeat cesarean section  POST-OPERATIVE DIAGNOSIS:  PPROM, PREVIOUS X 2, Repeat cesarean section  PROCEDURE:  Procedure(s): REPEAT CESAREAN SECTION PREVIOUS X 2 EDC: 06-26-20 ALLERGIES: NKDA (N/A) OOPHORECTOMY (Left)  SURGEON:  Surgeon(s) and Role:    * Harold Hedge, MD - Primary    * Anyanwu, Jethro Bastos, MD - Assisting  PHYSICIAN ASSISTANT:   ASSISTANTS:    ANESTHESIA:   spinal and general  EBL:  1485 ml   BLOOD ADMINISTERED:2 units CC PRBC and 2  FFP  DRAINS: Urinary Catheter (Foley)   LOCAL MEDICATIONS USED:  NONE  SPECIMEN:  Source of Specimen:  uterus, left tube and ovary  DISPOSITION OF SPECIMEN:  PATHOLOGY  COUNTS:  YES  TOURNIQUET:  * No tourniquets in log *  DICTATION: .Other Dictation: Dictation Number [id number]  PLAN OF CARE: Admit to inpatient   PATIENT DISPOSITION:  PACU - hemodynamically stable.   Delay start of Pharmacological VTE agent (>24hrs) due to surgical blood loss or risk of bleeding: not applicable

## 2020-05-29 NOTE — Op Note (Signed)
NAME: Tina Ramirez, Tina Ramirez MEDICAL RECORD [id number] ACCOUNT [id number] DATE OF BIRTH:Jul 15, 1975 FACILITY: [facility] LOCATION: [facility] PHYSICIAN:Jianni Batten E. Joscelyn Hardrick II, MD  OPERATIVE REPORT  DATE OF PROCEDURE:  05/29/2020  PREOPERATIVE DIAGNOSIS: 1.  Previous cesarean section, desires repeat. 2.  Intrauterine pregnancy at 35 and 5/7 weeks. 3.  Premature rupture of membranes and labor.  POSTOPERATIVE DIAGNOSES: 1.  Previous cesarean section, desires repeat. 2.  Intrauterine pregnancy 35 and 5/7 weeks. 3.  Premature rupture of membranes.  PROCEDURE: 1.  Repeat low transverse cesarean section. 2.  Left salpingo-oophorectomy.  SURGEON:  Harold Hedge II, MD  ASSISTANT:  Jaynie Collins, MD  ANESTHESIA:  Spinal, followed by general anesthesia.  ESTIMATED BLOOD LOSS:  1485 mL.  FINDINGS:  Viable female infant.  Apgars, birth weight, arterial cord pH pending.  SPECIMENS:  Placenta and left tube and ovary to pathology.  BLOOD PRODUCTS:  2 units of packed red blood cells and 2 units of fresh frozen plasma are started upon transport to the PACU.  INDICATIONS AND CONSENT:  This patient is a 45 year old G5 P2 with 3 previous cesarean sections.  She presents to MAU with spontaneous premature rupture of membranes for clear fluid.  Examination reveals gross rupture of fluid and a cervix that is  fingertip and 80%.  She then had the onset of increasingly hard uterine contractions.  She is given 1 shot of betamethasone and delivery was recommended.  Repeat cesarean section was discussed and potential risks and complications were discussed  preoperatively including but not limited to infection, organ damage, bleeding requiring transfusion of blood products with HIV and hepatitis acquisition, DVT, PE, pneumonia and wound breakdown.  The patient states she understands and agrees and consent  was signed on the chart.  DESCRIPTION OF PROCEDURE:  The patient was taken to the operating room where she was  identified.  Spinal anesthetic was placed and she was placed in the dorsal supine position with a 15-degree left lateral wedge.  She was then prepped vaginally with  Betadine.  Foley catheter was placed and prepped abdominally with ChloraPrep.  After 3 minute drying time, she was draped in sterile fashion.  Timeout has been undertaken.  After testing for adequate spinal anesthesia, skin was entered through the  Pfannenstiel scar and dissection was carried out in layers.  Peritoneal cavity was successfully entered, noting the bladder is adherent very high on the lower uterine segment.  This was dissected down bilaterally sharply and bluntly successfully.  This  exposes a uterine window that is totally translucent.  This easily ruptures and the vertex was then delivered followed by the rest of the baby without difficulty.  A good cry and tone is noted and after 1 minute, the cord was clamped and cut and the baby  was handed to waiting pediatrics team.  Placenta is manually delivered.  The uterus was exteriorized to better identify the layers for closure.  The bladder was very densely adherent to the very thin lower uterine segment and uterine window.  The uterus  was very carefully closed in a running locking fashion with 0 Monocryl suture.  There was persistent bleeding at the right angle of the uterine incision.  Three O'Leary stitches were placed under good visualization as well as numerous figure-of-eights  of 2-0 chromic and hemostasis was eventually obtained.  At this point, the uterus, needed to be replaced in the peritoneal cavity.  The adnexa had become engorged with venous blood flow.  Also, the incision was quite stiff and difficult  to stretch.  Upon  replacing the uterus, the left veins in the adnexa ruptured.  The uterus was exteriorized and this was controlled with Kelly clamps; however, repeated attempts to suture this proved to be unsuccessful with continued bleeding.  Therefore, the ovary,   distal tube were removed.  There was continued bleeding at various points on the pedicles.  At this point, Dr. Macon Large entered the room to assist.  The massive transfusion protocol was activated at that point. An additional dose of Ancef is ordered.  An i-STAT was done and returned with a  hemoglobin of 8.8.  Vascular clips were used to obtain hemostasis on the pedicle on the left side.  Additional figure-of-eights on the side of the uterine fundus were also used to obtain hemostasis.  The patient was given IM Methergine as well as TXA.  The uterus was returned to the peritoneal cavity.  A small amount of diffuse ooze was noted from the left side of the uterus.  Arista was then placed all along the uterine incision and on the left side of the uterus.  Pressure was then applied while the  TXA and Methergine were given.  Careful inspection revealed good hemostasis.  At this point, the blood products just arrived into the operating room.  The anterior fascia was closed in a running fashion with a 0 looped PDS and the skin was closed with  clips.  A pressure dressing was applied.  The patient was transferred to the PACU in stable condition where she will receive 2 units of packed cells and 2 units of fresh frozen plasma.  All counts were correct.  IN/NUANCE  D:05/29/2020 T:05/29/2020 JOB:[id number]

## 2020-05-29 NOTE — Transfer of Care (Signed)
Immediate Anesthesia Transfer of Care Note  Patient: Tina Ramirez  Procedure(s) Performed: REPEAT CESAREAN SECTION PREVIOUS X 2 EDC: 06-26-20 ALLERGIES: NKDA (N/A ) OOPHORECTOMY (Left )  Patient Location: PACU  Anesthesia Type:General and Spinal  Level of Consciousness: awake, alert  and patient cooperative  Airway & Oxygen Therapy: Patient Spontanous Breathing and Patient connected to nasal cannula oxygen  Post-op Assessment: Report given to RN, Post -op Vital signs reviewed and stable and Patient moving all extremities X 4  Post vital signs: Reviewed and stable  Last Vitals:  Vitals Value Taken Time  BP 96/76 05/29/20 0400  Temp    Pulse 86 05/29/20 0401  Resp 13 05/29/20 0401  SpO2 100 % 05/29/20 0401  Vitals shown include unvalidated device data.  Last Pain:  Vitals:   05/28/20 2132  PainSc: 0-No pain         Complications: No complications documented.

## 2020-05-29 NOTE — Anesthesia Preprocedure Evaluation (Signed)
Anesthesia Evaluation  Patient identified by MRN, date of birth, ID band Patient awake    Reviewed: Allergy & Precautions, NPO status , Patient's Chart, lab work & pertinent test results  Airway Mallampati: II  TM Distance: >3 FB Neck ROM: Full    Dental no notable dental hx. (+) Teeth Intact, Dental Advisory Given   Pulmonary neg pulmonary ROS,    Pulmonary exam normal breath sounds clear to auscultation       Cardiovascular Exercise Tolerance: Good Normal cardiovascular exam Rhythm:Regular Rate:Normal     Neuro/Psych Anxiety Depression negative neurological ROS     GI/Hepatic negative GI ROS, Neg liver ROS,   Endo/Other    Renal/GU negative Renal ROS     Musculoskeletal   Abdominal   Peds  Hematology T&S pending Lab Results      Component                Value               Date                      WBC                      7.1                 05/28/2020                HGB                      10.5 (L)            05/28/2020                HCT                      31.7 (L)            05/28/2020                MCV                      101.3 (H)           05/28/2020                PLT                      159                 05/28/2020              Anesthesia Other Findings   Reproductive/Obstetrics (+) Pregnancy                             Anesthesia Physical Anesthesia Plan  ASA: II  Anesthesia Plan: Spinal   Post-op Pain Management:    Induction: Intravenous  PONV Risk Score and Plan: 3 and Treatment may vary due to age or medical condition and Ondansetron  Airway Management Planned: Nasal Cannula and Natural Airway  Additional Equipment: None  Intra-op Plan:   Post-operative Plan:   Informed Consent: I have reviewed the patients History and Physical, chart, labs and discussed the procedure including the risks, benefits and alternatives for the proposed anesthesia with  the patient or authorized representative who has indicated his/her understanding and acceptance.  Plan Discussed with: CRNA and Anesthesiologist  Anesthesia Plan Comments: (G5P3 for repeat c/s under spinal T&S avail)        Anesthesia Quick Evaluation

## 2020-05-29 NOTE — Anesthesia Postprocedure Evaluation (Signed)
Anesthesia Post Note  Patient: Tina Ramirez  Procedure(s) Performed: REPEAT CESAREAN SECTION PREVIOUS X 2 EDC: 06-26-20 ALLERGIES: NKDA (N/A ) OOPHORECTOMY (Left )     Patient location during evaluation: Mother Baby Anesthesia Type: Spinal Level of consciousness: oriented and awake and alert Pain management: pain level controlled Vital Signs Assessment: post-procedure vital signs reviewed and stable Respiratory status: spontaneous breathing and respiratory function stable Cardiovascular status: blood pressure returned to baseline and stable Postop Assessment: no headache, no backache, no apparent nausea or vomiting and able to ambulate Anesthetic complications: no   No complications documented.  Last Vitals:  Vitals:   05/29/20 0550 05/29/20 0606  BP: 130/83 126/78  Pulse: 76 75  Resp: 14 18  Temp:  36.8 C  SpO2: 95% 97%    Last Pain:  Vitals:   05/29/20 0609  TempSrc:   PainSc: 0-No pain   Pain Goal:                Epidural/Spinal Function Cutaneous sensation: Able to Wiggle Toes (05/29/20 3086), Patient able to flex knees: Yes (05/29/20 5784), Patient able to lift hips off bed: Yes (05/29/20 0609), Back pain beyond tenderness at insertion site: No (05/29/20 0609), Progressively worsening motor and/or sensory loss: No (05/29/20 0609), Bowel and/or bladder incontinence post epidural: No (05/29/20 0609)  Trevor Iha

## 2020-05-29 NOTE — Anesthesia Procedure Notes (Signed)
Procedure Name: Intubation Date/Time: 05/29/2020 3:05 AM Performed by: Sandrea Matte, CRNA Pre-anesthesia Checklist: Patient identified, Emergency Drugs available, Suction available and Patient being monitored Patient Re-evaluated:Patient Re-evaluated prior to induction Oxygen Delivery Method: Circle system utilized Preoxygenation: Pre-oxygenation with 100% oxygen Induction Type: Cricoid Pressure applied, Rapid sequence and IV induction Laryngoscope Size: Mac and 3 Grade View: Grade II Tube type: Oral Tube size: 7.0 mm Number of attempts: 1 Airway Equipment and Method: Stylet and Oral airway

## 2020-05-30 ENCOUNTER — Encounter (HOSPITAL_COMMUNITY): Payer: Self-pay | Admitting: Obstetrics and Gynecology

## 2020-05-30 NOTE — Plan of Care (Signed)
  Problem: Activity: Goal: Will verbalize the importance of balancing activity with adequate rest periods Outcome: Progressing Goal: Ability to tolerate increased activity will improve Outcome: Progressing   Problem: Role Relationship: Goal: Ability to demonstrate positive interaction with newborn will improve Outcome: Progressing   Problem: Nutrition: Goal: Adequate nutrition will be maintained Outcome: Progressing   Problem: Coping: Goal: Level of anxiety will decrease Outcome: Progressing   Problem: Pain Managment: Goal: General experience of comfort will improve Outcome: Progressing

## 2020-05-30 NOTE — Progress Notes (Signed)
Postpartum Progress Note  Postpartum Day 1 s/p repeat Cesarean section.  Subjective:  Patient reports no overnight events.  She reports well controlled pain, ambulating without difficulty, foley removed, awaiting void, tolerating PO.  She reports Negative flatus, Negative BM.  Vaginal bleeding is minimal.  Objective: Blood pressure 112/64, pulse 76, temperature 98.5 F (36.9 C), temperature source Oral, resp. rate 17, height 5\' 1"  (1.549 m), weight 59 kg, last menstrual period 08/22/2019, SpO2 98 %, unknown if currently breastfeeding.  Physical Exam:  General: alert and no distress Lochia: appropriate Uterine Fundus: firm Incision: dressing in place DVT Evaluation: No evidence of DVT seen on physical exam.  Recent Labs    05/29/20 0728 05/29/20 1131  HGB 8.9* 8.8*  HCT 25.1* 25.3*    Assessment/Plan: . Postpartum Day 1, s/p C-section . Foley removed, awaiting void . S/p 2u pRBC and 2 FFP postoperatively.  Serial HGB stable yesterday with no signs this morning for acute process or symptomatic anemia. . Lactation following . History of constipation after prior C section, Miralax added . Desires, circ. Will complete prior to discharge with nursery clearance.  . Doing well, continue routine postpartum care. Anticipate discharge PPD 2 or 3   LOS: 1 day   07/27/20 05/30/2020, 7:29 AM

## 2020-05-31 LAB — PREPARE FRESH FROZEN PLASMA
Unit division: 0
Unit division: 0
Unit division: 0
Unit division: 0

## 2020-05-31 LAB — BPAM FFP
Blood Product Expiration Date: 202202122359
Blood Product Expiration Date: 202202122359
Blood Product Expiration Date: 202202122359
Blood Product Expiration Date: 202202122359
ISSUE DATE / TIME: 202202100330
ISSUE DATE / TIME: 202202100330
Unit Type and Rh: 6200
Unit Type and Rh: 6200
Unit Type and Rh: 6200
Unit Type and Rh: 6200

## 2020-05-31 NOTE — Progress Notes (Signed)
Postpartum Progress Note  Postpartum Day 2 s/p repeat Cesarean section with left salpingo-oophorectomy  Subjective:  Patient reports no overnight events.  She reports well controlled pain, ambulating without difficulty, foley removed, awaiting void, tolerating PO.  She reports Negative flatus, Negative BM.  Vaginal bleeding is minimal. She has been ambulating more.   Objective: Blood pressure 126/79, pulse 83, temperature 98 F (36.7 C), temperature source Oral, resp. rate 17, height 5\' 1"  (1.549 m), weight 59 kg, last menstrual period 08/22/2019, SpO2 100 %, unknown if currently breastfeeding.  Physical Exam:  General: alert and no distress Lochia: appropriate Uterine Fundus: firm Incision: dressing in place DVT Evaluation: No evidence of DVT seen on physical exam.  Recent Labs    05/29/20 0728 05/29/20 1131  HGB 8.9* 8.8*  HCT 25.1* 25.3*    Assessment/Plan: . Postpartum Day 2 s/p RCS with LSO . S/p 2u pRBC and 2 FFP postoperatively.  Serial HGB stable yesterday with no signs this morning for acute process or symptomatic anemia. . Lactation following . Patient desires discharge today, will await return of flatus prior to release. She has history of constipation following previous C sections, currently taking Miralax and colace.  07/27/20 is s/p circ . Doing well, encouraged ambulation, awaiting flatus and likely discharge home today.    LOS: 2 days   Pecola Leisure 05/31/2020, 7:18 AM

## 2020-06-01 LAB — TYPE AND SCREEN
ABO/RH(D): A POS
Antibody Screen: NEGATIVE
Unit division: 0
Unit division: 0
Unit division: 0
Unit division: 0

## 2020-06-01 LAB — BPAM RBC
Blood Product Expiration Date: 202203012359
Blood Product Expiration Date: 202203012359
Blood Product Expiration Date: 202203012359
Blood Product Expiration Date: 202203012359
ISSUE DATE / TIME: 202202071041
ISSUE DATE / TIME: 202202071041
ISSUE DATE / TIME: 202202100328
ISSUE DATE / TIME: 202202100328
Unit Type and Rh: 6200
Unit Type and Rh: 6200
Unit Type and Rh: 6200
Unit Type and Rh: 6200

## 2020-06-01 MED ORDER — ACETAMINOPHEN 325 MG PO TABS: 650.0000 mg | ORAL_TABLET | Freq: Four times a day (QID) | ORAL | 0 refills | Status: DC | PRN

## 2020-06-01 MED ORDER — DOCUSATE SODIUM 100 MG PO CAPS: 100.0000 mg | ORAL_CAPSULE | Freq: Two times a day (BID) | ORAL | 0 refills | Status: DC

## 2020-06-01 MED ORDER — OXYCODONE HCL 5 MG PO TABS: 5.0000 mg | ORAL_TABLET | ORAL | 0 refills | Status: DC | PRN

## 2020-06-01 MED ORDER — POLYETHYLENE GLYCOL 3350 17 G PO PACK: 17.0000 g | PACK | Freq: Every day | ORAL | 0 refills | Status: DC

## 2020-06-01 NOTE — Progress Notes (Signed)
Postpartum Progress Note  Postpartum Day 3 s/p repeat Cesarean section with left salpingo-oophorectomy  Subjective:  Patient reports no overnight events. Patient reports passing flatus this AM. She reports well controlled pain, ambulating without difficulty, foley removed, awaiting void, tolerating PO.  She reports negative BM.  Vaginal bleeding is minimal. She has been ambulating more.   Objective: Blood pressure 118/87, pulse 87, temperature 98.2 F (36.8 C), temperature source Oral, resp. rate 18, height 5\' 1"  (1.549 m), weight 59 kg, last menstrual period 08/22/2019, SpO2 98 %, unknown if currently breastfeeding.  Physical Exam:  General: alert and no distress Lochia: appropriate Uterine Fundus: firm Incision: dressing in place DVT Evaluation: No evidence of DVT seen on physical exam.  Recent Labs    05/29/20 1131  HGB 8.8*  HCT 25.3*    Assessment/Plan: . Postpartum Day 3 s/p RCS with LSO . S/p 2u pRBC and 2 FFP postoperatively.  Serial HGB stable yesterday with no signs this morning for acute process or symptomatic anemia. . Lactation following . Good appetite and passing flatus.  Continue stool softeners at home . Baby is s/p circ . Discharge home today.    LOS: 3 days   07/27/20 06/01/2020, 8:21 AM

## 2020-06-01 NOTE — Discharge Summary (Signed)
Obstetric Discharge Summary  Tina Ramirez is a 45 y.o. female that presented on 05/28/2020 for leakage of fluid at [redacted]w[redacted]d.  What was found to have ruptured membranes and has a history of prior C sections.   She was admitted to labor and delivery for repeat C section.  Surgical course was notable for bleeding from engorged adnexal vasculature and she underwent left salpingo-oophorectomy.  She received two units of pRBCs and 2 FFP for perioperative blood loss. She delivered a viable female infant on 05/29/2020.  Her postpartum course was uncomplicated and on PPD#3, she reported well controlled pain, spontaneous voiding, ambulating without difficulty, and tolerating PO. She has a history of constipation following previous surgeries and was started on stool softeners. She reported flatus prior to discharge.  She was stable for discharge home on 3 with plans for in-office follow up and staple removal.   Hemoglobin  Date Value Ref Range Status  05/29/2020 8.8 (L) 12.0 - 15.0 g/dL Final   HCT  Date Value Ref Range Status  05/29/2020 25.3 (L) 36.0 - 46.0 % Final    Physical Exam:  General: alert and no distress Lochia: appropriate Uterine Fundus: firm Incision: healing well DVT Evaluation: No evidence of DVT seen on physical exam.  Discharge Diagnoses: Term Pregnancy-delivered  Discharge Information: Date: 06/01/2020 Activity: pelvic rest and as tolerated Diet: routine Medications: tylenol, motrin, oxycodone, colace, miralax Condition: stable Instructions: refer to practice specific booklet Discharge to: home   Newborn Data: Live born female  Birth Weight: 6 lb 9.1 oz (2980 g) APGAR: 8, 9  Newborn Delivery   Birth date/time: 05/29/2020 01:36:00 Delivery type: C-Section, Low Transverse Trial of labor: No C-section categorization: Repeat      Home with mother.  Lyn Henri 06/01/2020, 6:22 PM

## 2020-06-01 NOTE — Progress Notes (Signed)
Discharge instructions reviewed with patient: PP care, HTN s/s, infection s/s, follow-up appointment, breast care for non-breastfeeding mothers, depression s/s. Discharge instructions for baby given to mom with FOB present: safe sleep, formula feeding instructions, s/s to call peds. Patient and FOB verbalize understanding.

## 2020-06-02 ENCOUNTER — Telehealth: Payer: Self-pay

## 2020-06-02 LAB — SURGICAL PATHOLOGY

## 2020-06-02 NOTE — Telephone Encounter (Signed)
Transition Care Management Unsuccessful Follow-up Telephone Call  Date of discharge and from where:  06/01/20 from [facility]  Attempts:  1st Attempt  Reason for unsuccessful TCM follow-up call:  Left voice message

## 2020-06-03 NOTE — Telephone Encounter (Signed)
Transition Care Management Unsuccessful Follow-up Telephone Call  Date of discharge and from where:  06/01/20 from [facility]  Attempts:  2nd Attempt  Reason for unsuccessful TCM follow-up call:  Left voice message

## 2020-06-04 NOTE — Telephone Encounter (Signed)
Transition Care Management Unsuccessful Follow-up Telephone Call  Date of discharge and from where:  06/01/20 from [facility]  Attempts:  3rd Attempt  Reason for unsuccessful TCM follow-up call:  Unable to reach patient

## 2020-06-20 ENCOUNTER — Inpatient Hospital Stay (HOSPITAL_COMMUNITY): Admit: 2020-06-20 | Payer: Managed Care, Other (non HMO) | Admitting: Obstetrics and Gynecology

## 2020-06-21 ENCOUNTER — Other Ambulatory Visit: Payer: Self-pay | Admitting: Family Medicine

## 2020-06-30 ENCOUNTER — Telehealth: Payer: Self-pay | Admitting: Family Medicine

## 2020-06-30 NOTE — Telephone Encounter (Signed)
Nurses Please talk with patient.  Find out what is going on.  Depression issues?  Make sure patient is not suicidal.  If so would need PHQ-9.  Would also need a follow-up office visit this week.  More details needed.

## 2020-06-30 NOTE — Telephone Encounter (Signed)
Patient states she can not come to the office for a visit and is not suicidal but does need something to help her with everything going on in her life. Patient would be willing to do a phone visit but is not willing to come in office

## 2020-06-30 NOTE — Telephone Encounter (Signed)
Patient is wanting refill on Prozac , she doesn't remember what mg because it been a while. [facility]

## 2020-07-01 NOTE — Telephone Encounter (Signed)
If cannot come to the office will need to schedule phone visit.  Preferably this week.

## 2020-07-02 ENCOUNTER — Telehealth: Payer: Managed Care, Other (non HMO) | Admitting: Family Medicine

## 2020-07-03 ENCOUNTER — Other Ambulatory Visit: Payer: Self-pay

## 2020-07-03 ENCOUNTER — Telehealth (INDEPENDENT_AMBULATORY_CARE_PROVIDER_SITE_OTHER): Payer: Managed Care, Other (non HMO) | Admitting: Family Medicine

## 2020-07-03 ENCOUNTER — Telehealth: Payer: Self-pay | Admitting: Family Medicine

## 2020-07-03 DIAGNOSIS — F321 Major depressive disorder, single episode, moderate: Secondary | ICD-10-CM

## 2020-07-03 DIAGNOSIS — F411 Generalized anxiety disorder: Secondary | ICD-10-CM

## 2020-07-03 MED ORDER — FLUOXETINE HCL 20 MG PO TABS: 20.0000 mg | ORAL_TABLET | Freq: Every day | ORAL | 2 refills | Status: DC

## 2020-07-03 NOTE — Telephone Encounter (Signed)
[name], frick are scheduled for a virtual visit with your provider today.    Just as we do with appointments in the office, we must obtain your consent to participate.  Your consent will be active for this visit and any virtual visit you may have with one of our providers in the next 365 days.    If you have a MyChart account, I can also send a copy of this consent to you electronically.  All virtual visits are billed to your insurance company just like a traditional visit in the office.  As this is a virtual visit, video technology does not allow for your provider to perform a traditional examination.  This may limit your provider's ability to fully assess your condition.  If your provider identifies any concerns that need to be evaluated in person or the need to arrange testing such as labs, EKG, etc, we will make arrangements to do so.    Although advances in technology are sophisticated, we cannot ensure that it will always work on either your end or our end.  If the connection with a video visit is poor, we may have to switch to a telephone visit.  With either a video or telephone visit, we are not always able to ensure that we have a secure connection.   I need to obtain your verbal consent now.   Are you willing to proceed with your visit today?   Tina Ramirez has provided verbal consent on 07/03/2020 for a virtual visit (video or telephone).   Marlowe Shores, LPN 10/27/6267  48:54 AM

## 2020-07-03 NOTE — Progress Notes (Signed)
   Subjective:    Patient ID: Tina Ramirez, female    DOB: March 08, 1976, 45 y.o.   MRN: [id number]  HPI Pt would like to discuss depression med. Pt not currently on any med for depression. GAD 7 and PHQ 9 completed.  generalized anxiety disorder patient finds himself feeling stressed to some degree down denies being major depressed.  But when we went over her GAD-7 limits obvious that she is having some stress related issues she is finding herself feeling anxious nervous on edge and she is also having moderate depression but not suicidal GAD 7 : Generalized Anxiety Score 07/03/2020 05/18/2018  Nervous, Anxious, on Edge 3 3  Control/stop worrying 3 3  Worry too much - different things 3 3  Trouble relaxing 3 3  Restless 0 1  Easily annoyed or irritable 3 3  Afraid - awful might happen 0 0  Total GAD 7 Score 15 16  Anxiety Difficulty Somewhat difficult Somewhat difficult   Depression screen [facility] 2/9 07/03/2020 12/27/2018 05/18/2018  Decreased Interest 2 0 1  Down, Depressed, Hopeless 2 0 1  PHQ - 2 Score 4 0 2  Altered sleeping 2 2 1   Tired, decreased energy 3 1 3   Change in appetite 3 0 0  Feeling bad or failure about yourself  0 0 0  Trouble concentrating 2 1 0  Moving slowly or fidgety/restless 0 0 0  Suicidal thoughts 0 0 0  PHQ-9 Score 14 4 6   Difficult doing work/chores Somewhat difficult Somewhat difficult Somewhat difficult     Virtual Visit via Telephone Note  I connected with on 07/03/20 at 11:50 AM EDT by telephone and verified that I am speaking with the correct person using two identifiers.  Location: Patient: home Provider: office   I discussed the limitations, risks, security and privacy concerns of performing an evaluation and management service by telephone and the availability of in person appointments. I also discussed with the patient that there may be a patient responsible charge related to this service. The patient expressed understanding and agreed to  proceed.   History of Present Illness:    Observations/Objective:   Assessment and Plan:   Follow Up Instructions:    I discussed the assessment and treatment plan with the patient. The patient was provided an opportunity to ask questions and all were answered. The patient agreed with the plan and demonstrated an understanding of the instructions.   The patient was advised to call back or seek an in-person evaluation if the symptoms worsen or if the condition fails to improve as anticipated.  I provided 20 minutes of non-face-to-face time during this encounter.       Review of Systems Please see above    Objective:   Physical Exam  Today's visit was via telephone Physical exam was not possible for this visit       Assessment & Plan:  Moderate depression Mild anxiety and stress Start Prozac 20 mg daily Patient aware of side effects Not suicidal She was cautioned that if she starts finding herself feeling depressed in a severe manner or suicidal to immediately reach out for help here or ER Patient to give update how she is doing in 2 to 3 weeks and we will do a phone visit with her in 4 to 5 weeks

## 2020-07-10 ENCOUNTER — Encounter: Payer: Self-pay | Admitting: Family Medicine

## 2020-07-10 NOTE — Telephone Encounter (Signed)
Changed into phone message.

## 2020-07-15 NOTE — Telephone Encounter (Signed)
Nurses This is concerning that there is projectile vomiting with every feed and he is in discomfort and irritable.  This is a sign that he may well have a condition known as pyloric stenosis I would recommend talking with Rosalita Chessman.  If the child is in fact having discomfort irritability projectile vomiting I would recommend urgent evaluation at pediatric ER at [facility] for possible pyloric stenosis.  If there is pyloric stenosis they would have a pediatric surgeon help address this/do surgery. (Other option would be [facility] ER) this would require evaluation and typically ultrasound to help diagnose this

## 2020-07-15 NOTE — Telephone Encounter (Signed)
Mother advised per Dr Lorin Picket: This is concerning that there is projectile vomiting with every feed and he is in discomfort and irritable.  This is a sign that he may well have a condition known as pyloric stenosis Dr Lorin Picket  would recommend talking with Rosalita Chessman.  If the child is in fact having discomfort irritability projectile vomiting Dr Lorin Picket would recommend urgent evaluation at pediatric ER at [facility] for possible pyloric stenosis.  If there is pyloric stenosis they would have a pediatric surgeon help address this/do surgery. (Other option would be [facility] ER) this would require evaluation and typically ultrasound to help diagnose this. Mother verbalized understanding and will take the patient to pediatric ER for evaluation.

## 2020-07-28 NOTE — Telephone Encounter (Signed)
Transferred into telephone call in pt chart.

## 2020-08-07 ENCOUNTER — Telehealth: Payer: Managed Care, Other (non HMO) | Admitting: Family Medicine

## 2020-08-07 ENCOUNTER — Other Ambulatory Visit: Payer: Self-pay

## 2021-07-23 ENCOUNTER — Encounter: Payer: Self-pay | Admitting: Nurse Practitioner

## 2021-07-23 ENCOUNTER — Ambulatory Visit: Payer: Managed Care, Other (non HMO) | Admitting: Nurse Practitioner

## 2021-07-23 VITALS — BP 97/62 | HR 85 | Temp 98.8°F | Ht 61.02 in | Wt 105.4 lb

## 2021-07-23 DIAGNOSIS — F411 Generalized anxiety disorder: Secondary | ICD-10-CM

## 2021-07-23 DIAGNOSIS — Z8616 Personal history of COVID-19: Secondary | ICD-10-CM | POA: Diagnosis not present

## 2021-07-23 DIAGNOSIS — Z7689 Persons encountering health services in other specified circumstances: Secondary | ICD-10-CM

## 2021-07-23 DIAGNOSIS — F321 Major depressive disorder, single episode, moderate: Secondary | ICD-10-CM | POA: Diagnosis not present

## 2021-07-23 MED ORDER — FLUOXETINE HCL 20 MG PO TABS: 20.0000 mg | ORAL_TABLET | Freq: Every day | ORAL | 2 refills | Status: DC

## 2021-07-23 NOTE — Progress Notes (Signed)
? ?New Patient Office Visit ? ?Subjective:  ?Patient ID: Tina Ramirez, female    DOB: 1976-02-06  Age: 46 y.o. MRN: [id number] ? ?CC:  ?Chief Complaint  ?Patient presents with  ? New Patient (Initial Visit)  ? ? ?HPI ?Tina Ramirez presents to establish new primary care provider. Is relocating from family practice provider in [place].  ?Hsa trouble with depression and anxiety. Has struggled with this for years.  ?-has been on prozac in the past. Has been on and off of this for many years.  ?-problems with focus and concentration. Starting to effect her job. States that her boss has noted that she is not herself at this time.  ?-she has been unable to pinpoint a time in which this started to be so significant.  ?-has little energy. Hard to get up and get going in the mornings.  ?-does have history of COVID twice with her last pregnancy. Developed pneumonia due to [place]. Had to have two iron infusions during the second bout of COVID 19.  ?-due to have labs.  ? ?Past Medical History:  ?Diagnosis Date  ? AMA (advanced maternal age) multigravida 47+   ? Celiac disease   ? pt says was a misdiagnosis  ? Depression   ? HSV (herpes simplex virus) anogenital infection   ? IBS (irritable bowel syndrome)   ? Vaginal Pap smear, abnormal   ? ? ?Past Surgical History:  ?Procedure Laterality Date  ? ADENOIDECTOMY    ? CESAREAN SECTION    ? x 2  ? CESAREAN SECTION N/A 02/03/2016  ? Procedure: CESAREAN SECTION;  Surgeon: Louretta Shorten, MD;  Location: [place];  Service: Obstetrics;  Laterality: N/A;  Repeat ?edc 02/09/16 ?NKDA ?Needs RNFA  ? CESAREAN SECTION N/A 05/28/2020  ? Procedure: REPEAT CESAREAN SECTION PREVIOUS X 2 EDC: 06-26-20 ALLERGIES: NKDA;  Surgeon: Everlene Farrier, MD;  Location: [facility] LD ORS;  Service: Obstetrics;  Laterality: N/A;  ? cold knife conization  2007  ? ESOPHAGOGASTRODUODENOSCOPY  10/06/2011  ? Procedure: ESOPHAGOGASTRODUODENOSCOPY (EGD);  Surgeon: Rogene Houston, MD;  [facility];  Service:  Endoscopy;  Laterality: N/A;  310  ? HAND SURGERY    ? right x 2   ? MOUTH SURGERY    ? OOPHORECTOMY Left 05/28/2020  ? Procedure: OOPHORECTOMY;  Surgeon: Everlene Farrier, MD;  Location: [place] LD ORS;  Service: Obstetrics;  Laterality: Left;  ? ? ?Family History  ?Problem Relation Age of Onset  ? Asthma Son   ? Transient ischemic attack Mother   ? ? ?Social History  ? ?Socioeconomic History  ? Marital status: Married  ?  Spouse name: Not on file  ? Number of children: Not on file  ? Years of education: Not on file  ? Highest education level: Not on file  ?Occupational History  ? Not on file  ?Tobacco Use  ? Smoking status: Never  ? Smokeless tobacco: Never  ?Vaping Use  ? Vaping Use: Never used  ?Substance and Sexual Activity  ? Alcohol use: Yes  ? Drug use: No  ? Sexual activity: Yes  ?  Birth control/protection: None  ?Other Topics Concern  ? Not on file  ?Social History Narrative  ? Not on file  ? ?Social Determinants of Health  ? ?Financial Resource Strain: Not on file  ?Food Insecurity: Not on file  ?Transportation Needs: Not on file  ?Physical Activity: Not on file  ?Stress: Not on file  ?Social Connections: Not on file  ?  Intimate Partner Violence: Not on file  ? ? ?ROS ?Review of Systems  ?Constitutional:  Positive for fatigue. Negative for activity change, appetite change, chills and fever.  ?HENT:  Negative for congestion, postnasal drip, rhinorrhea, sinus pressure, sinus pain, sneezing and sore throat.   ?Eyes: Negative.   ?Respiratory:  Negative for cough, chest tightness, shortness of breath and wheezing.   ?Cardiovascular:  Negative for chest pain and palpitations.  ?Gastrointestinal:  Negative for abdominal pain, constipation, diarrhea, nausea and vomiting.  ?Endocrine: Negative for cold intolerance, heat intolerance, polydipsia and polyuria.  ?Genitourinary:  Negative for dyspareunia, dysuria, flank pain, frequency and urgency.  ?Musculoskeletal:  Negative for arthralgias, back pain and myalgias.  ?Skin:   Negative for rash.  ?Allergic/Immunologic: Negative for environmental allergies.  ?Neurological:  Positive for headaches. Negative for dizziness and weakness.  ?Hematological:  Negative for adenopathy.  ?Psychiatric/Behavioral:  Positive for decreased concentration and dysphoric mood. The patient is nervous/anxious.   ? ?Objective:  ? ?Today's Vitals  ? 07/23/21 1511  ?BP: 97/62  ?Pulse: 85  ?Temp: 98.8 ?F (37.1 ?C)  ?SpO2: 90%  ?Weight: 105 lb 6.4 oz (47.8 kg)  ?Height: 5' 1.02" (1.55 m)  ? ?Body mass index is 19.9 kg/m?.  ? ?Physical Exam ?Vitals and nursing note reviewed.  ?Constitutional:   ?   Appearance: Normal appearance. She is well-developed.  ?HENT:  ?   Head: Normocephalic and atraumatic.  ?   Nose: Nose normal.  ?   Mouth/Throat:  ?   Mouth: Mucous membranes are moist.  ?   Pharynx: Oropharynx is clear.  ?Eyes:  ?   Extraocular Movements: Extraocular movements intact.  ?   Conjunctiva/sclera: Conjunctivae normal.  ?   Pupils: Pupils are equal, round, and reactive to light.  ?Cardiovascular:  ?   Rate and Rhythm: Normal rate and regular rhythm.  ?   Pulses: Normal pulses.  ?   Heart sounds: Normal heart sounds.  ?Pulmonary:  ?   Effort: Pulmonary effort is normal.  ?   Breath sounds: Normal breath sounds.  ?Abdominal:  ?   Palpations: Abdomen is soft.  ?Musculoskeletal:     ?   General: Normal range of motion.  ?   Cervical back: Normal range of motion and neck supple.  ?Lymphadenopathy:  ?   Cervical: No cervical adenopathy.  ?Skin: ?   General: Skin is warm and dry.  ?   Capillary Refill: Capillary refill takes less than 2 seconds.  ?Neurological:  ?   General: No focal deficit present.  ?   Mental Status: She is alert and oriented to person, place, and time.  ?Psychiatric:     ?   Attention and Perception: Attention and perception normal.     ?   Mood and Affect: Mood is anxious and depressed.     ?   Behavior: Behavior normal. Behavior is cooperative.     ?   Thought Content: Thought content normal.      ?   Cognition and Memory: Cognition and memory normal.     ?   Judgment: Judgment normal.  ? ? ?Assessment & Plan:  ?1. GAD (generalized anxiety disorder) ?Restart fluoxetine 20 mg tablets daily.  Discussed potential referral to psychiatry.  We will reassess in 2 weeks. ?- FLUoxetine (PROZAC) 20 MG tablet; Take 1 tablet (20 mg total) by mouth daily.  Dispense: 30 tablet; Refill: 2 ? ?2. Depression, major, single episode, moderate ([place]) ?Restart fluoxetine 20 mg tablets daily.  Discussed  potential referral to psychiatry.  We will reassess in 2 weeks. ?- FLUoxetine (PROZAC) 20 MG tablet; Take 1 tablet (20 mg total) by mouth daily.  Dispense: 30 tablet; Refill: 2 ? ?3. History of COVID-19 ?Patient with history of COVID-19 twice.  May be contributing to lack of concentration and focus.  We will reassess further in 2 weeks. ? ?4. Encounter to establish care ?Appointment today to establish new primary care provider.  Request medical records from previous PCP to review and update patient chart. ? ? ?Problem List Items Addressed This Visit   ? ?  ? Other  ? GAD (generalized anxiety disorder) - Primary  ? Relevant Medications  ? FLUoxetine (PROZAC) 20 MG tablet  ? Depression, major, single episode, moderate ([place])  ? Relevant Medications  ? FLUoxetine (PROZAC) 20 MG tablet  ? History of COVID-19  ? ?Other Visit Diagnoses   ? ? Encounter to establish care      ? ?  ? ? ?Outpatient Encounter Medications as of 07/23/2021  ?Medication Sig  ? FLUoxetine (PROZAC) 20 MG tablet Take 1 tablet (20 mg total) by mouth daily.  ? [DISCONTINUED] FLUoxetine (PROZAC) 20 MG tablet Take 1 tablet (20 mg total) by mouth daily.  ? [DISCONTINUED] Prenatal Vit-Fe Fumarate-FA (PRENATAL MULTIVITAMIN) TABS tablet Take 1 tablet by mouth daily at 12 noon.  ? ?No facility-administered encounter medications on file as of 07/23/2021.  ? ? ?Follow-up: Return in about 2 weeks (around 08/06/2021), or mood - labs needed - labs next week. cbc, ferritin, b12,  folate, cmp, HgbA1c, TSH, Free T4, for can we get most recent progress notes and labs from GYN provider. thanks ..  ? ?Ronnell Freshwater, NP ? ?

## 2021-07-23 NOTE — Progress Notes (Deleted)
? ?New Patient Office Visit ? ?Subjective:  ?Patient ID: Tina Ramirez, female    DOB: April 07, 1976  Age: 46 y.o. MRN: [id number] ? ?CC:  ?Chief Complaint  ?Patient presents with  ? New Patient (Initial Visit)  ? ? ?HPI ?Tina Ramirez presents for *** ? ?Past Medical History:  ?Diagnosis Date  ? AMA (advanced maternal age) multigravida 35+   ? Celiac disease   ? pt says was a misdiagnosis  ? Depression   ? HSV (herpes simplex virus) anogenital infection   ? IBS (irritable bowel syndrome)   ? Vaginal Pap smear, abnormal   ? ? ?Past Surgical History:  ?Procedure Laterality Date  ? ADENOIDECTOMY    ? CESAREAN SECTION    ? x 2  ? CESAREAN SECTION N/A 02/03/2016  ? Procedure: CESAREAN SECTION;  Surgeon: Candice Camp, MD;  Location: [facility] BIRTHING SUITES;  Service: Obstetrics;  Laterality: N/A;  Repeat ?edc 02/09/16 ?NKDA ?Needs RNFA  ? CESAREAN SECTION N/A 05/28/2020  ? Procedure: REPEAT CESAREAN SECTION PREVIOUS X 2 EDC: 06-26-20 ALLERGIES: NKDA;  Surgeon: Harold Hedge, MD;  Location: [facility] LD ORS;  Service: Obstetrics;  Laterality: N/A;  ? cold knife conization  2007  ? ESOPHAGOGASTRODUODENOSCOPY  10/06/2011  ? Procedure: ESOPHAGOGASTRODUODENOSCOPY (EGD);  Surgeon: Malissa Hippo, MD;  Location: [facility];  Service: Endoscopy;  Laterality: N/A;  310  ? HAND SURGERY    ? right x 2   ? MOUTH SURGERY    ? OOPHORECTOMY Left 05/28/2020  ? Procedure: OOPHORECTOMY;  Surgeon: Harold Hedge, MD;  Location: [facility] LD ORS;  Service: Obstetrics;  Laterality: Left;  ? ? ?Family History  ?Problem Relation Age of Onset  ? Asthma Son   ? Transient ischemic attack Mother   ? ? ?Social History  ? ?Socioeconomic History  ? Marital status: Married  ?  Spouse name: Not on file  ? Number of children: Not on file  ? Years of education: Not on file  ? Highest education level: Not on file  ?Occupational History  ? Not on file  ?Tobacco Use  ? Smoking status: Never  ? Smokeless tobacco: Never  ?Vaping Use  ? Vaping Use: Never used  ?Substance and Sexual  Activity  ? Alcohol use: Yes  ? Drug use: No  ? Sexual activity: Yes  ?  Birth control/protection: None  ?Other Topics Concern  ? Not on file  ?Social History Narrative  ? Not on file  ? ?Social Determinants of Health  ? ?Financial Resource Strain: Not on file  ?Food Insecurity: Not on file  ?Transportation Needs: Not on file  ?Physical Activity: Not on file  ?Stress: Not on file  ?Social Connections: Not on file  ?Intimate Partner Violence: Not on file  ? ? ?ROS ?Review of Systems ? ?Objective:  ? ?Today's Vitals: BP 97/62   Pulse 85   Temp 98.8 ?F (37.1 ?C)   Ht 5' 1.02" (1.55 m)   Wt 105 lb 6.4 oz (47.8 kg)   SpO2 90%   BMI 19.90 kg/m?  ? ?Physical Exam ? ?Assessment & Plan:  ? ?Problem List Items Addressed This Visit   ?None ? ? ?Outpatient Encounter Medications as of 07/23/2021  ?Medication Sig  ? [DISCONTINUED] FLUoxetine (PROZAC) 20 MG tablet Take 1 tablet (20 mg total) by mouth daily.  ? [DISCONTINUED] Prenatal Vit-Fe Fumarate-FA (PRENATAL MULTIVITAMIN) TABS tablet Take 1 tablet by mouth daily at 12 noon.  ? ?No facility-administered encounter medications on file as of 07/23/2021.  ? ? ?  Follow-up: No follow-ups on file.  ? ?Carlean Jews, NP ? ?

## 2021-07-29 ENCOUNTER — Other Ambulatory Visit: Payer: Self-pay

## 2021-07-29 DIAGNOSIS — Z130 Encounter for screening for diseases of the blood and blood-forming organs and certain disorders involving the immune mechanism: Secondary | ICD-10-CM

## 2021-07-29 DIAGNOSIS — Z0000 Encounter for general adult medical examination without abnormal findings: Secondary | ICD-10-CM

## 2021-07-29 DIAGNOSIS — D509 Iron deficiency anemia, unspecified: Secondary | ICD-10-CM

## 2021-07-29 DIAGNOSIS — F411 Generalized anxiety disorder: Secondary | ICD-10-CM

## 2021-07-30 ENCOUNTER — Other Ambulatory Visit: Payer: Managed Care, Other (non HMO)

## 2021-07-30 DIAGNOSIS — Z0000 Encounter for general adult medical examination without abnormal findings: Secondary | ICD-10-CM

## 2021-07-30 DIAGNOSIS — Z130 Encounter for screening for diseases of the blood and blood-forming organs and certain disorders involving the immune mechanism: Secondary | ICD-10-CM

## 2021-07-30 DIAGNOSIS — D509 Iron deficiency anemia, unspecified: Secondary | ICD-10-CM

## 2021-07-31 LAB — CBC WITH DIFFERENTIAL/PLATELET
Basophils Absolute: 0 10*3/uL (ref 0.0–0.2)
Basos: 1 %
EOS (ABSOLUTE): 0.1 10*3/uL (ref 0.0–0.4)
Eos: 1 %
Hematocrit: 36.8 % (ref 34.0–46.6)
Hemoglobin: 12.5 g/dL (ref 11.1–15.9)
Immature Grans (Abs): 0 10*3/uL (ref 0.0–0.1)
Immature Granulocytes: 0 %
Lymphocytes Absolute: 2 10*3/uL (ref 0.7–3.1)
Lymphs: 42 %
MCH: 31.2 pg (ref 26.6–33.0)
MCHC: 34 g/dL (ref 31.5–35.7)
MCV: 92 fL (ref 79–97)
Monocytes Absolute: 0.3 10*3/uL (ref 0.1–0.9)
Monocytes: 5 %
Neutrophils Absolute: 2.4 10*3/uL (ref 1.4–7.0)
Neutrophils: 51 %
Platelets: 291 10*3/uL (ref 150–450)
RBC: 4.01 x10E6/uL (ref 3.77–5.28)
RDW: 12.3 % (ref 11.7–15.4)
WBC: 4.7 10*3/uL (ref 3.4–10.8)

## 2021-07-31 LAB — COMPREHENSIVE METABOLIC PANEL
ALT: 11 IU/L (ref 0–32)
AST: 17 IU/L (ref 0–40)
Albumin/Globulin Ratio: 1.8 (ref 1.2–2.2)
Albumin: 4.7 g/dL (ref 3.8–4.8)
Alkaline Phosphatase: 53 IU/L (ref 44–121)
BUN/Creatinine Ratio: 10 (ref 9–23)
BUN: 7 mg/dL (ref 6–24)
Bilirubin Total: 0.3 mg/dL (ref 0.0–1.2)
CO2: 25 mmol/L (ref 20–29)
Calcium: 9.3 mg/dL (ref 8.7–10.2)
Chloride: 104 mmol/L (ref 96–106)
Creatinine, Ser: 0.67 mg/dL (ref 0.57–1.00)
Globulin, Total: 2.6 g/dL (ref 1.5–4.5)
Glucose: 94 mg/dL (ref 70–99)
Potassium: 4 mmol/L (ref 3.5–5.2)
Sodium: 145 mmol/L — ABNORMAL HIGH (ref 134–144)
Total Protein: 7.3 g/dL (ref 6.0–8.5)
eGFR: 110 mL/min/{1.73_m2} (ref >59)

## 2021-07-31 LAB — T4, FREE: Free T4: 1.12 ng/dL (ref 0.82–1.77)

## 2021-07-31 LAB — FOLATE: Folate: 12.2 ng/mL (ref >3.0)

## 2021-07-31 LAB — HEMOGLOBIN A1C
Est. average glucose Bld gHb Est-mCnc: 105 mg/dL
Hgb A1c MFr Bld: 5.3 % (ref 4.8–5.6)

## 2021-07-31 LAB — TSH: TSH: 0.912 u[IU]/mL (ref 0.450–4.500)

## 2021-07-31 LAB — VITAMIN B12: Vitamin B-12: 429 pg/mL (ref 232–1245)

## 2021-07-31 LAB — FERRITIN: Ferritin: 156 ng/mL — ABNORMAL HIGH (ref 15–150)

## 2021-08-02 DIAGNOSIS — Z8616 Personal history of COVID-19: Secondary | ICD-10-CM | POA: Insufficient documentation

## 2021-08-02 DIAGNOSIS — F321 Major depressive disorder, single episode, moderate: Secondary | ICD-10-CM | POA: Insufficient documentation

## 2021-08-02 NOTE — Progress Notes (Signed)
Overall, labs good. Discuss with patient at visit 08/06/2021

## 2021-08-05 NOTE — Progress Notes (Signed)
Established patient visit ? ? ?Patient: Tina Ramirez   DOB: May 13, 1975   46 y.o. Female  MRN: [id number] ?Visit Date: 08/06/2021 ? ? ?Chief Complaint  ?Patient presents with  ? Follow-up  ? ?Subjective  ?  ?HPI  ?Follow up visit  ?-restarted fluoxetine for anxiety/depression. Feels like this is improving since starting back on Prozac. Feels like she has a little more energy. Is able to do more things.  ?-continues to have problems with focus and concentration which effect her job. States that her boss has noted that she is not herself at this time. States that starting the prozac has not changed or improved since starting on prozac.  ?-she has been unable to pinpoint a time in which this started to be so significant.  ?-does have history of COVID twice with her last pregnancy. Developed pneumonia due to [place]. Had to have two iron infusions during the second bout of COVID 19.  ?-routine fasting labs done along with anemia panel  ?--ferritin slightly elevated ?--sodium 145 ?--other labs normal  ? ? ?Medications: ?Outpatient Medications Prior to Visit  ?Medication Sig  ? FLUoxetine (PROZAC) 20 MG tablet Take 1 tablet (20 mg total) by mouth daily.  ? ?No facility-administered medications prior to visit.  ? ? ?Review of Systems  ?Constitutional:  Positive for fatigue. Negative for activity change, appetite change, chills and fever.  ?HENT:  Negative for congestion, postnasal drip, rhinorrhea, sinus pressure, sinus pain, sneezing and sore throat.   ?Eyes: Negative.   ?Respiratory:  Negative for cough, chest tightness, shortness of breath and wheezing.   ?Cardiovascular:  Negative for chest pain and palpitations.  ?Gastrointestinal:  Negative for abdominal pain, constipation, diarrhea, nausea and vomiting.  ?Endocrine: Negative for cold intolerance, heat intolerance, polydipsia and polyuria.  ?Genitourinary:  Negative for dyspareunia, dysuria, flank pain, frequency and urgency.  ?Musculoskeletal:  Negative for arthralgias,  back pain and myalgias.  ?Skin:  Negative for rash.  ?Allergic/Immunologic: Negative for environmental allergies.  ?Neurological:  Negative for dizziness, weakness and headaches.  ?Hematological:  Negative for adenopathy.  ?Psychiatric/Behavioral:  Positive for decreased concentration and dysphoric mood. The patient is not nervous/anxious.   ?     Improved since adding back fluoxetine, but continues to have significant lack of focus and concentration. This is effecting her job and her ability to work well.   ? ?Last CBC ?Lab Results  ?Component Value Date  ? WBC 4.7 07/30/2021  ? HGB 12.5 07/30/2021  ? HCT 36.8 07/30/2021  ? MCV 92 07/30/2021  ? MCH 31.2 07/30/2021  ? RDW 12.3 07/30/2021  ? PLT 291 07/30/2021  ? ?Last metabolic panel ?Lab Results  ?Component Value Date  ? GLUCOSE 94 07/30/2021  ? NA 145 (H) 07/30/2021  ? K 4.0 07/30/2021  ? CL 104 07/30/2021  ? CO2 25 07/30/2021  ? BUN 7 07/30/2021  ? CREATININE 0.67 07/30/2021  ? EGFR 110 07/30/2021  ? CALCIUM 9.3 07/30/2021  ? PROT 7.3 07/30/2021  ? ALBUMIN 4.7 07/30/2021  ? LABGLOB 2.6 07/30/2021  ? AGRATIO 1.8 07/30/2021  ? BILITOT 0.3 07/30/2021  ? ALKPHOS 53 07/30/2021  ? AST 17 07/30/2021  ? ALT 11 07/30/2021  ? ANIONGAP 11 04/02/2020  ? ?Last lipids ?No results found for: CHOL, HDL, LDLCALC, LDLDIRECT, TRIG, CHOLHDL ?Last hemoglobin A1c ?Lab Results  ?Component Value Date  ? HGBA1C 5.3 07/30/2021  ? ?Last thyroid functions ?Lab Results  ?Component Value Date  ? TSH 0.912 07/30/2021  ? ?Last vitamin D ?  No results found for: 25OHVITD2, [place], VD25OH ?Last vitamin B12 and Folate ?Lab Results  ?Component Value Date  ? WGNFAOZH08 429 07/30/2021  ? FOLATE 12.2 07/30/2021  ? ?  ? ? Objective  ?  ? ?Today's Vitals  ? 08/06/21 1600  ?BP: 107/72  ?Pulse: 79  ?Temp: 98.5 ?F (36.9 ?C)  ?SpO2: 99%  ?Weight: 100 lb 9.6 oz (45.6 kg)  ?Height: 5' 1.02" (1.55 m)  ? ?Body mass index is 18.99 kg/m?.  ? ?BP Readings from Last 3 Encounters:  ?08/06/21 107/72  ?07/23/21 97/62   ?06/01/20 118/87  ?  ?Wt Readings from Last 3 Encounters:  ?08/06/21 100 lb 9.6 oz (45.6 kg)  ?07/23/21 105 lb 6.4 oz (47.8 kg)  ?05/28/20 130 lb (59 kg)  ?  ?Physical Exam ?Vitals and nursing note reviewed.  ?Constitutional:   ?   Appearance: Normal appearance. She is well-developed.  ?HENT:  ?   Head: Normocephalic and atraumatic.  ?Eyes:  ?   Pupils: Pupils are equal, round, and reactive to light.  ?Cardiovascular:  ?   Rate and Rhythm: Normal rate and regular rhythm.  ?   Pulses: Normal pulses.  ?   Heart sounds: Normal heart sounds.  ?Pulmonary:  ?   Effort: Pulmonary effort is normal.  ?   Breath sounds: Normal breath sounds.  ?Abdominal:  ?   Palpations: Abdomen is soft.  ?Musculoskeletal:     ?   General: Normal range of motion.  ?   Cervical back: Normal range of motion and neck supple.  ?Lymphadenopathy:  ?   Cervical: No cervical adenopathy.  ?Skin: ?   General: Skin is warm and dry.  ?   Capillary Refill: Capillary refill takes less than 2 seconds.  ?Neurological:  ?   General: No focal deficit present.  ?   Mental Status: She is alert and oriented to person, place, and time.  ?Psychiatric:     ?   Mood and Affect: Mood normal.     ?   Behavior: Behavior normal.     ?   Thought Content: Thought content normal.     ?   Judgment: Judgment normal.  ?  ? ? Assessment & Plan  ?  ?1. Generalized anxiety disorder ?Improved on current dose fluoxetine. Continue as prescribed  ? ?2. Adult attention deficit disorder ?Trial of adderall 83m tablets. May take this one to two times daily as needed to help with focus and concentration.  Reviewed risks and possible side effects associated with taking Stimulants. Combination of these drugs with other psychotropic medications could cause dizziness and drowsiness. Pt needs to Monitor symptoms and exercise caution in driving and operating heavy machinery to avoid damages to oneself, to others and to the surroundings. Patient verbalized understanding in this matter.  Dependence and abuse for these drugs will be monitored closely.  ?Will have the patient complete an ASRS 1.1 at her next visit for more formal screening for adult ADD.  ?- amphetamine-dextroamphetamine (ADDERALL) 5 MG tablet; Take 1 tablet (5 mg total) by mouth 2 (two) times daily as needed.  Dispense: 60 tablet; Refill: 0  ? ?Problem List Items Addressed This Visit   ? ?  ? Other  ? Generalized anxiety disorder - Primary  ? Adult attention deficit disorder  ? Relevant Medications  ? amphetamine-dextroamphetamine (ADDERALL) 5 MG tablet  ?  ? ?Return in about 4 weeks (around 09/03/2021) for mood, ADD - needs to do ASRS 1.1 at next visit .  ?   ? ? ? ? ?  Ronnell Freshwater, NP  ?[facility] ?[phone] (phone) ?[phone] (fax) ? ?[facility]  ?

## 2021-08-06 ENCOUNTER — Encounter: Payer: Self-pay | Admitting: Nurse Practitioner

## 2021-08-06 ENCOUNTER — Ambulatory Visit (INDEPENDENT_AMBULATORY_CARE_PROVIDER_SITE_OTHER): Payer: Managed Care, Other (non HMO) | Admitting: Nurse Practitioner

## 2021-08-06 VITALS — BP 107/72 | HR 79 | Temp 98.5°F | Ht 61.02 in | Wt 100.6 lb

## 2021-08-06 DIAGNOSIS — F988 Other specified behavioral and emotional disorders with onset usually occurring in childhood and adolescence: Secondary | ICD-10-CM | POA: Diagnosis not present

## 2021-08-06 DIAGNOSIS — F411 Generalized anxiety disorder: Secondary | ICD-10-CM | POA: Diagnosis not present

## 2021-08-06 MED ORDER — AMPHETAMINE-DEXTROAMPHETAMINE 5 MG PO TABS: 5.0000 mg | ORAL_TABLET | Freq: Two times a day (BID) | ORAL | 0 refills | Status: DC | PRN

## 2021-08-10 DIAGNOSIS — F988 Other specified behavioral and emotional disorders with onset usually occurring in childhood and adolescence: Secondary | ICD-10-CM | POA: Insufficient documentation

## 2021-09-03 ENCOUNTER — Ambulatory Visit: Payer: Managed Care, Other (non HMO) | Admitting: Nurse Practitioner

## 2021-09-08 ENCOUNTER — Encounter: Payer: Self-pay | Admitting: Nurse Practitioner

## 2021-09-08 ENCOUNTER — Ambulatory Visit (INDEPENDENT_AMBULATORY_CARE_PROVIDER_SITE_OTHER): Payer: Managed Care, Other (non HMO) | Admitting: Nurse Practitioner

## 2021-09-08 VITALS — BP 90/63 | HR 87 | Temp 98.1°F | Ht 61.02 in | Wt 98.1 lb

## 2021-09-08 DIAGNOSIS — F321 Major depressive disorder, single episode, moderate: Secondary | ICD-10-CM

## 2021-09-08 DIAGNOSIS — F988 Other specified behavioral and emotional disorders with onset usually occurring in childhood and adolescence: Secondary | ICD-10-CM

## 2021-09-08 MED ORDER — AMPHETAMINE-DEXTROAMPHETAMINE 10 MG PO TABS: 10.0000 mg | ORAL_TABLET | Freq: Two times a day (BID) | ORAL | 0 refills | Status: DC | PRN

## 2021-09-08 NOTE — Progress Notes (Signed)
Established patient visit   Patient: Tina Ramirez   DOB: Mar 06, 1976   46 y.o. Female  MRN: [id number] Visit Date: 09/08/2021   Chief Complaint  Patient presents with   Follow-up   Subjective    HPI  Follow up visit for anxiety and ADHD.  -currently on fluoxetine  -started trial adderall 5mg  twice daily as needed to help with focus and concentration. States that this has helped her, especially in the work environment. Feels like the medication is not quite at the dosing it should be. States that Tina Ramirez has not noticed interference with her appetite. Tina Ramirez has had a two pound weight loss, though Tina Ramirez had a higher weight loss prior to starting this medication. Tina Ramirez denies problem with sleeping due to the medication. Does not take on the weekends or when Tina Ramirez is off work. Tina Ramirez states that her productivity has improved. Employer has noted an improvement in work performance.  An adult ASRS 1.1 was administered today. Results are as follows:  Adult ADHD Self Report Scale (most recent)     Adult ADHD Self-Report Scale (ASRS-v1.1) Symptom Checklist - 09/08/21 1607       Part A   1. How often do you have trouble wrapping up the final details of a project, once the challenging parts have been done? Rarely  2. How often do you have difficulty getting things done in order when you have to do a task that requires organization? Sometimes    3. How often do you have problems remembering appointments or obligations? Sometimes  4. When you have a task that requires a lot of thought, how often do you avoid or delay getting started? Often    5. How often do you fidget or squirm with your hands or feet when you have to sit down for a long time? Often  6. How often do you feel overly active and compelled to do things, like you were driven by a motor? Rarely      Part B   7. How often do you make careless mistakes when you have to work on a boring or difficult project? Sometimes  8. How often do you have difficulty  keeping your attention when you are doing boring or repetitive work? Often    9. How often do you have difficulty concentrating on what people say to you, even when they are speaking to you directly? Sometimes  10. How often do you misplace or have difficulty finding things at home or at work? Often    11. How often are you distracted by activity or noise around you? Sometimes  12. How often do you leave your seat in meetings or other situations in which you are expected to remain seated? Never    13. How often do you feel restless or fidgety? Rarely  14. How often do you have difficulty unwinding and relaxing when you have time to yourself? Sometimes    15. How often do you find yourself talking too much when you are in social situations? Rarely  16. When you are in a conversation, how often do you find yourself finishing the sentences of the people you are talking to, before they can finish them themselves? Sometimes    17. How often do you have difficulty waiting your turn in situations when turn taking is required? Rarely  18. How often do you interrupt others when they are busy? Never      Comment   How old were you when these  problems first began to occur? 46                 Medications: Outpatient Medications Prior to Visit  Medication Sig   FLUoxetine (PROZAC) 20 MG tablet Take 1 tablet (20 mg total) by mouth daily.   [DISCONTINUED] amphetamine-dextroamphetamine (ADDERALL) 5 MG tablet Take 1 tablet (5 mg total) by mouth 2 (two) times daily as needed.   No facility-administered medications prior to visit.    Review of Systems  Constitutional:  Negative for activity change, appetite change, chills, fatigue and fever.       Weight loss of two pounds since last visit. Improved energy and activity levels   HENT:  Negative for congestion, postnasal drip, rhinorrhea, sinus pressure, sinus pain, sneezing and sore throat.   Eyes: Negative.   Respiratory:  Negative for cough, chest  tightness, shortness of breath and wheezing.   Cardiovascular:  Negative for chest pain and palpitations.  Gastrointestinal:  Negative for abdominal pain, constipation, diarrhea, nausea and vomiting.  Endocrine: Negative for cold intolerance, heat intolerance, polydipsia and polyuria.  Genitourinary:  Negative for dyspareunia, dysuria, flank pain, frequency and urgency.  Musculoskeletal:  Negative for arthralgias, back pain and myalgias.  Skin:  Negative for rash.  Allergic/Immunologic: Negative for environmental allergies.  Neurological:  Negative for dizziness, weakness and headaches.  Hematological:  Negative for adenopathy.  Psychiatric/Behavioral:  Positive for decreased concentration and dysphoric mood. The patient is nervous/anxious.        Improved concentration and productivity at work since starting low dose adderall. Mood continues to be improved on fluoxetine.       Objective     Today's Vitals   09/08/21 1541  BP: 90/63  Pulse: 87  Temp: 98.1 F (36.7 C)  SpO2: 99%  Weight: 98 lb 1.9 oz (44.5 kg)  Height: 5' 1.02" (1.55 m)   Body mass index is 18.53 kg/m.   BP Readings from Last 3 Encounters:  09/08/21 90/63  08/06/21 107/72  07/23/21 97/62    Wt Readings from Last 3 Encounters:  09/08/21 98 lb 1.9 oz (44.5 kg)  08/06/21 100 lb 9.6 oz (45.6 kg)  07/23/21 105 lb 6.4 oz (47.8 kg)    Physical Exam Vitals and nursing note reviewed.  Constitutional:      Appearance: Normal appearance. Tina Ramirez is well-developed.  HENT:     Head: Normocephalic and atraumatic.  Eyes:     Pupils: Pupils are equal, round, and reactive to light.  Cardiovascular:     Rate and Rhythm: Normal rate and regular rhythm.     Pulses: Normal pulses.     Heart sounds: Normal heart sounds.  Pulmonary:     Effort: Pulmonary effort is normal.     Breath sounds: Normal breath sounds.  Abdominal:     Palpations: Abdomen is soft.  Musculoskeletal:        General: Normal range of motion.      Cervical back: Normal range of motion and neck supple.  Lymphadenopathy:     Cervical: No cervical adenopathy.  Skin:    General: Skin is warm and dry.     Capillary Refill: Capillary refill takes less than 2 seconds.  Neurological:     General: No focal deficit present.     Mental Status: Tina Ramirez is alert and oriented to person, place, and time.  Psychiatric:        Mood and Affect: Mood normal.        Behavior: Behavior normal.  Thought Content: Thought content normal.        Judgment: Judgment normal.      Assessment & Plan     1. Depression, major, single episode, moderate (HCC) Stable. Continue fluoxetine as prescribed   2. Adult attention deficit disorder Patient score of 4/6 on adult ASRS 1.1 self assessment for adult attention deficit. Mildly improved symptoms on adderall 5mg . Will increase to 10mg  twice daily as needed to help with focus and concentration. Encouraged her to refrain from taking on weekends and days off from work. Tina Ramirez voiced understanding of instructions. Will reassess in 4 weeks and adjust dosing as indicated.  - amphetamine-dextroamphetamine (ADDERALL) 10 MG tablet; Take 1 tablet (10 mg total) by mouth 2 (two) times daily as needed.  Dispense: 60 tablet; Refill: 0   Problem List Items Addressed This Visit       Other   Depression, major, single episode, moderate (HCC) - Primary   Adult attention deficit disorder   Relevant Medications   amphetamine-dextroamphetamine (ADDERALL) 10 MG tablet     Return in about 4 weeks (around 10/06/2021) for mood, ADD.         , NP  [facility] [phone] (phone) [phone] (fax)  [facility]

## 2021-10-06 ENCOUNTER — Ambulatory Visit (INDEPENDENT_AMBULATORY_CARE_PROVIDER_SITE_OTHER): Payer: Managed Care, Other (non HMO) | Admitting: Nurse Practitioner

## 2021-10-06 DIAGNOSIS — Z91199 Patient's noncompliance with other medical treatment and regimen due to unspecified reason: Secondary | ICD-10-CM

## 2021-10-06 NOTE — Progress Notes (Deleted)
Established patient visit   Patient: Tina Ramirez   DOB: 1975-08-24   46 y.o. Female  MRN: [id number] Visit Date: 10/06/2021   No chief complaint on file.  Subjective    HPI  Follow up depression and ADHD -takes prozac 20 mg daily -adderall dosing was increased to 10 mg twice daily to help her with focus and concentration. She did score 4/6 on adult ASRS 1.1 self- assessment for ADHD.  -she does need to have controlled substances policy signed per this office.  -PDMP profile reviewed today 10/06/2021. Overdose risk score is 190. No red flags noted .   Medications: Outpatient Medications Prior to Visit  Medication Sig   amphetamine-dextroamphetamine (ADDERALL) 10 MG tablet Take 1 tablet (10 mg total) by mouth 2 (two) times daily as needed.   FLUoxetine (PROZAC) 20 MG tablet Take 1 tablet (20 mg total) by mouth daily.   No facility-administered medications prior to visit.    Review of Systems  {Labs (Optional):23779}   Objective    There were no vitals taken for this visit. BP Readings from Last 3 Encounters:  09/08/21 90/63  08/06/21 107/72  07/23/21 97/62    Wt Readings from Last 3 Encounters:  09/08/21 98 lb 1.9 oz (44.5 kg)  08/06/21 100 lb 9.6 oz (45.6 kg)  07/23/21 105 lb 6.4 oz (47.8 kg)    Physical Exam  ***  No results found for any visits on 10/06/21.  Assessment & Plan     Problem List Items Addressed This Visit   None    No follow-ups on file.         Carlean Jews, NP  [facility] [phone] (phone) [phone] (fax)  [facility]

## 2021-10-27 ENCOUNTER — Ambulatory Visit: Payer: Managed Care, Other (non HMO) | Admitting: Nurse Practitioner

## 2021-10-27 ENCOUNTER — Encounter: Payer: Self-pay | Admitting: Nurse Practitioner

## 2021-10-27 VITALS — BP 94/60 | HR 75 | Ht 61.02 in | Wt 97.1 lb

## 2021-10-27 DIAGNOSIS — F411 Generalized anxiety disorder: Secondary | ICD-10-CM

## 2021-10-27 DIAGNOSIS — F321 Major depressive disorder, single episode, moderate: Secondary | ICD-10-CM

## 2021-10-27 DIAGNOSIS — R636 Underweight: Secondary | ICD-10-CM | POA: Insufficient documentation

## 2021-10-27 DIAGNOSIS — N390 Urinary tract infection, site not specified: Secondary | ICD-10-CM | POA: Diagnosis not present

## 2021-10-27 DIAGNOSIS — F988 Other specified behavioral and emotional disorders with onset usually occurring in childhood and adolescence: Secondary | ICD-10-CM | POA: Diagnosis not present

## 2021-10-27 DIAGNOSIS — R319 Hematuria, unspecified: Secondary | ICD-10-CM | POA: Diagnosis not present

## 2021-10-27 LAB — POCT URINALYSIS DIP (CLINITEK)
Bilirubin, UA: NEGATIVE
Glucose, UA: NEGATIVE mg/dL
Ketones, POC UA: NEGATIVE mg/dL
Nitrite, UA: NEGATIVE
POC PROTEIN,UA: NEGATIVE
Spec Grav, UA: >=1.03 — AB (ref 1.010–1.025)
Urobilinogen, UA: 0.2 E.U./dL
pH, UA: 6 (ref 5.0–8.0)

## 2021-10-27 MED ORDER — AMPHETAMINE-DEXTROAMPHETAMINE 10 MG PO TABS
10.0000 mg | ORAL_TABLET | Freq: Two times a day (BID) | ORAL | 0 refills | Status: DC | PRN
Start: 2021-10-27 — End: 2021-10-27

## 2021-10-27 MED ORDER — AMPHETAMINE-DEXTROAMPHETAMINE 10 MG PO TABS: 10.0000 mg | ORAL_TABLET | Freq: Two times a day (BID) | ORAL | 0 refills | Status: DC | PRN

## 2021-10-27 MED ORDER — AMOXICILLIN-POT CLAVULANATE 875-125 MG PO TABS: 1.0000 | ORAL_TABLET | Freq: Two times a day (BID) | ORAL | 0 refills | Status: DC

## 2021-10-27 MED ORDER — FLUOXETINE HCL 20 MG PO TABS: 20.0000 mg | ORAL_TABLET | Freq: Every day | ORAL | 1 refills | Status: DC

## 2021-10-27 NOTE — Progress Notes (Signed)
Established patient visit   Patient: Tina Ramirez   DOB: 16-May-1975   46 y.o. Female  MRN: [id number] Visit Date: 10/27/2021   Chief Complaint  Patient presents with   ADHD   Subjective    HPI  Routine follow up for ADHD.  -currently on dose adderall 10 mg twice daily as needed  -feels like this 10 mg dose is adequate and helping her to stay focused and on track at home and at work. She denies negative side effects. She states that appetite is not reduced. Though underweight, her weight has remained stable.  -she states that she is also doing well with current dose of fluoxetine.  -the patient states that the combination of the two medications has really changed her life for the better.  -needs to have controlled substances policy agreement completed PDMP profile 10/27/2021 - overdose risk score is 190.  No red flags  or state indicators noted.   -review of   Having bladder fullness and frequency of urination. Not having dysuria. Is having bladder spasms toward the end of the urine stream. Denies back pain or flank pain. Denies nausea or vomiting. Denies fever.     Medications: Outpatient Medications Prior to Visit  Medication Sig   levonorgestrel (MIRENA, 52 MG,) 20 MCG/DAY IUD Take 1 device by intrauterine route.   [DISCONTINUED] amphetamine-dextroamphetamine (ADDERALL) 10 MG tablet Take 1 tablet (10 mg total) by mouth 2 (two) times daily as needed.   [DISCONTINUED] FLUoxetine (PROZAC) 20 MG tablet Take 1 tablet (20 mg total) by mouth daily.   [DISCONTINUED] FLUoxetine (PROZAC) 20 MG capsule Take 20 mg by mouth daily.   No facility-administered medications prior to visit.    Review of Systems  Constitutional:  Negative for activity change, appetite change, chills, fatigue and fever.  HENT:  Negative for congestion, postnasal drip, rhinorrhea, sinus pressure, sinus pain, sneezing and sore throat.   Eyes: Negative.   Respiratory:  Negative for cough, chest tightness, shortness  of breath and wheezing.   Cardiovascular:  Negative for chest pain and palpitations.  Gastrointestinal:  Negative for abdominal pain, constipation, diarrhea, nausea and vomiting.  Endocrine: Negative for cold intolerance, heat intolerance, polydipsia and polyuria.  Genitourinary:  Positive for difficulty urinating, frequency and urgency. Negative for dyspareunia, dysuria and flank pain.  Musculoskeletal:  Negative for arthralgias, back pain and myalgias.  Skin:  Negative for rash.  Allergic/Immunologic: Negative for environmental allergies.  Neurological:  Negative for dizziness, weakness and headaches.  Hematological:  Negative for adenopathy.  Psychiatric/Behavioral:  Positive for decreased concentration and dysphoric mood. The patient is not nervous/anxious.        Doing well with current doses of fluoxetine and adderall.        Objective     Today's Vitals   10/27/21 0919  BP: 94/60  Pulse: 75  SpO2: 98%  Weight: 97 lb 1.9 oz (44.1 kg)  Height: 5' 1.02" (1.55 m)   Body mass index is 18.34 kg/m.   BP Readings from Last 3 Encounters:  10/27/21 94/60  09/08/21 90/63  08/06/21 107/72    Wt Readings from Last 3 Encounters:  10/27/21 97 lb 1.9 oz (44.1 kg)  09/08/21 98 lb 1.9 oz (44.5 kg)  08/06/21 100 lb 9.6 oz (45.6 kg)    Physical Exam Vitals and nursing note reviewed.  Constitutional:      Appearance: Normal appearance. She is well-developed and underweight.  HENT:     Head: Normocephalic and atraumatic.  Nose: Nose normal.     Mouth/Throat:     Mouth: Mucous membranes are moist.     Pharynx: Oropharynx is clear.  Eyes:     Extraocular Movements: Extraocular movements intact.     Conjunctiva/sclera: Conjunctivae normal.     Pupils: Pupils are equal, round, and reactive to light.  Cardiovascular:     Rate and Rhythm: Normal rate and regular rhythm.     Pulses: Normal pulses.     Heart sounds: Normal heart sounds.  Pulmonary:     Effort: Pulmonary effort  is normal.     Breath sounds: Normal breath sounds.  Abdominal:     Palpations: Abdomen is soft.  Genitourinary:    Comments: U/a positive for trace WBC and trace blood.  Musculoskeletal:        General: Normal range of motion.     Cervical back: Normal range of motion and neck supple.  Lymphadenopathy:     Cervical: No cervical adenopathy.  Skin:    General: Skin is warm and dry.     Capillary Refill: Capillary refill takes less than 2 seconds.  Neurological:     General: No focal deficit present.     Mental Status: She is alert and oriented to person, place, and time.  Psychiatric:        Mood and Affect: Mood normal.        Behavior: Behavior normal.        Thought Content: Thought content normal.        Judgment: Judgment normal.       Assessment & Plan    1. Urinary tract infection with hematuria, site unspecified Urine sample positive or trace WBC and trace blood. Start augmentin 875 mg twice daily for 3 days. Send urine for culture and sensitivity and adjust antibiotics as indicated.  - amoxicillin-clavulanate (AUGMENTIN) 875-125 MG tablet; Take 1 tablet by mouth 2 (two) times daily.  Dispense: 6 tablet; Refill: 0  2. Adult attention deficit disorder Well managed symptoms on current dose adderall. Will continue 10 mg BID prn dosing for next 3 consecutive months. Prescriptions sent to pharmacy today. Dates are 10/27/2021, 11/25/2021, and 12/24/2021. A controlled substances agreement policy was completed and signed today, 10/27/2021.  - amphetamine-dextroamphetamine (ADDERALL) 10 MG tablet; Take 1 tablet (10 mg total) by mouth 2 (two) times daily as needed.  Dispense: 60 tablet; Refill: 0  3. Generalized anxiety disorder Well managed on fluoxetine. Continue 20 mg dosing. A new 90 day prescription was sent to her pharmacy.   4. Depression, major, single episode, moderate (HCC) Well managed on fluoxetine. Continue 20 mg dosing. A new 90 day prescription was sent to her pharmacy.   - FLUoxetine (PROZAC) 20 MG tablet; Take 1 tablet (20 mg total) by mouth daily.  Dispense: 90 tablet; Refill: 1  5. Underweight Weight stable. Discussed high protein diet and increased activity level to help stabilize and increase weight    Problem List Items Addressed This Visit       Genitourinary   Urinary tract infection with hematuria - Primary   Relevant Medications   amoxicillin-clavulanate (AUGMENTIN) 875-125 MG tablet     Other   Generalized anxiety disorder   Relevant Medications   FLUoxetine (PROZAC) 20 MG tablet   Depression, major, single episode, moderate (HCC)   Relevant Medications   FLUoxetine (PROZAC) 20 MG tablet   Adult attention deficit disorder   Relevant Medications   amphetamine-dextroamphetamine (ADDERALL) 10 MG tablet   Underweight  Return in about 3 months (around 01/27/2022) for mood, ADD.         Carlean Jews, NP  [facility] at [facility] [phone] (phone) [phone] (fax)  [facility]

## 2021-10-27 NOTE — Progress Notes (Signed)
Treated with augmentin 875 mg twice daily for 3 days. Will adjust treatment after results of culture and sensitivity have returned .

## 2021-10-27 NOTE — Addendum Note (Signed)
Addended by: Thad Ranger on: 10/27/2021 10:47 AM   Modules accepted: Orders

## 2021-10-30 LAB — URINE CULTURE

## 2021-11-13 ENCOUNTER — Institutional Professional Consult (permissible substitution): Payer: Managed Care, Other (non HMO) | Admitting: Plastic Surgery

## 2021-11-20 ENCOUNTER — Ambulatory Visit: Payer: Self-pay | Admitting: Plastic Surgery

## 2021-11-20 ENCOUNTER — Encounter: Payer: Self-pay | Admitting: Plastic Surgery

## 2021-11-20 VITALS — BP 104/69 | HR 81 | Ht 61.0 in | Wt 97.0 lb

## 2021-11-20 DIAGNOSIS — Z411 Encounter for cosmetic surgery: Secondary | ICD-10-CM

## 2021-11-22 NOTE — Progress Notes (Signed)
Referring Provider Carlean Jews, NP [address] [place] [place],  [place] [address]   CC:  Breast augmentation.  Tina Ramirez is an 46 y.o. female.  HPI: 27-year-old who is interested in breast augmentation.  She is currently in a cup.  She has had a normal mammogram but none in the past year.  She will get 1 prior to surgery.  She is a non-smoker.  She would like inframammary fold incision.  She is undecided about saline versus silicone.    No Known Allergies  Outpatient Encounter Medications as of 11/20/2021  Medication Sig   amphetamine-dextroamphetamine (ADDERALL) 10 MG tablet Take 1 tablet (10 mg total) by mouth 2 (two) times daily as needed.   FLUoxetine (PROZAC) 20 MG tablet Take 1 tablet (20 mg total) by mouth daily.   levonorgestrel (MIRENA, 52 MG,) 20 MCG/DAY IUD Take 1 device by intrauterine route.   amoxicillin-clavulanate (AUGMENTIN) 875-125 MG tablet Take 1 tablet by mouth 2 (two) times daily.   No facility-administered encounter medications on file as of 11/20/2021.     Past Medical History:  Diagnosis Date   AMA (advanced maternal age) multigravida 35+    Celiac disease    pt says was a misdiagnosis   Depression    HSV (herpes simplex virus) anogenital infection    IBS (irritable bowel syndrome)    Vaginal Pap smear, abnormal     Past Surgical History:  Procedure Laterality Date   ADENOIDECTOMY     CESAREAN SECTION     x 2   CESAREAN SECTION N/A 02/03/2016   Procedure: CESAREAN SECTION;  Surgeon: Candice Camp, MD;  Location: [facility] BIRTHING SUITES;  Service: Obstetrics;  Laterality: N/A;  Repeat edc 02/09/16 NKDA Needs RNFA   CESAREAN SECTION N/A 05/28/2020   Procedure: REPEAT CESAREAN SECTION PREVIOUS X 2 EDC: 06-26-20 ALLERGIES: NKDA;  Surgeon: Harold Hedge, MD;  Location: [facility] LD ORS;  Service: Obstetrics;  Laterality: N/A;   cold knife conization  2007   ESOPHAGOGASTRODUODENOSCOPY  10/06/2011   Procedure: ESOPHAGOGASTRODUODENOSCOPY (EGD);  Surgeon:  Malissa Hippo, MD;  Location: [facility] ENDO SUITE;  Service: Endoscopy;  Laterality: N/A;  310   HAND SURGERY     right x 2    MOUTH SURGERY     OOPHORECTOMY Left 05/28/2020   Procedure: OOPHORECTOMY;  Surgeon: Harold Hedge, MD;  Location: [facility] LD ORS;  Service: Obstetrics;  Laterality: Left;    Family History  Problem Relation Age of Onset   Asthma Son    Transient ischemic attack Mother     Social History   Social History Narrative   Not on file     Review of Systems General: Denies fevers, chills, weight loss CV: Denies chest pain, shortness of breath, palpitations   Physical Exam    11/20/2021    2:41 PM 10/27/2021    9:19 AM 09/08/2021    3:41 PM  Vitals with BMI  Height 5\' 1"  5' 1.024" 5' 1.024"  Weight 97 lbs 97 lbs 2 oz 98 lbs 2 oz  BMI 18.34 18.34 18.53  Systolic 104 94 90  Diastolic 69 60 63  Pulse 81 75 87    General:  No acute distress,  Alert and oriented, Non-Toxic, Normal speech and affect Breast: No notch nipple 21 on the right and 21 on the left base with 13 bilaterally nipple to fold 6 bilaterally.  All measurements in centimeters.  Assessment/Plan Good candidate for breast augmentation.  We will plan implants  in the 200-300 cc range.  Most likely a 250 cc implant would be a good fit for her.  She is undecided about saline versus silicone.    [name] 11/22/2021, 8:55 AM

## 2021-11-25 ENCOUNTER — Telehealth: Payer: Self-pay | Admitting: Plastic Surgery

## 2021-11-25 NOTE — Telephone Encounter (Signed)
Pt is calling in wanting to see if she can get the quote for her surgery.  I am not seeing that she has been scheduled for surgery.  Pt would like to have a call back to let her know.

## 2021-11-26 ENCOUNTER — Encounter: Payer: Self-pay | Admitting: *Deleted

## 2021-11-30 ENCOUNTER — Telehealth: Payer: Self-pay | Admitting: *Deleted

## 2021-11-30 NOTE — Telephone Encounter (Signed)
Spoke to patient about scheduling surgery. She stated she is not ready to schedule at this time and will call me back when she is.

## 2022-01-25 NOTE — Progress Notes (Signed)
Patient was a no-show for this visit.

## 2022-01-27 ENCOUNTER — Ambulatory Visit: Payer: Managed Care, Other (non HMO) | Admitting: Nurse Practitioner

## 2022-02-21 NOTE — Progress Notes (Deleted)
Established patient visit   Patient: Tina Ramirez   DOB: [birth year]   46 y.o. Female  MRN: [id number] Visit Date: 02/22/2022   No chief complaint on file.  Subjective    HPI  Follow up medication  - taking adderall 10 mg  twice daily when needed  -pdmp profile reviewed 02/21/2022  -overdose risk score 190 -no red flags or state  indicators  -appropriate fill history     Medications: Outpatient Medications Prior to Visit  Medication Sig   amoxicillin-clavulanate (AUGMENTIN) 875-125 MG tablet Take 1 tablet by mouth 2 (two) times daily.   amphetamine-dextroamphetamine (ADDERALL) 10 MG tablet Take 1 tablet (10 mg total) by mouth 2 (two) times daily as needed.   FLUoxetine (PROZAC) 20 MG tablet Take 1 tablet (20 mg total) by mouth daily.   levonorgestrel (MIRENA, 52 MG,) 20 MCG/DAY IUD Take 1 device by intrauterine route.   No facility-administered medications prior to visit.    Review of Systems  {Labs (Optional):23779}   Objective    There were no vitals filed for this visit. There is no height or weight on file to calculate BMI.  BP Readings from Last 3 Encounters:  11/20/21 104/69  10/27/21 94/60  09/08/21 90/63    Wt Readings from Last 3 Encounters:  11/20/21 97 lb (44 kg)  10/27/21 97 lb 1.9 oz (44.1 kg)  09/08/21 98 lb 1.9 oz (44.5 kg)    Physical Exam  ***  No results found for any visits on 02/22/22.  Assessment & Plan     Problem List Items Addressed This Visit   None    No follow-ups on file.         Ronnell Freshwater, NP  [facility] at [facility] [phone] (phone) [phone] (fax)  [place]

## 2022-02-22 ENCOUNTER — Ambulatory Visit: Payer: Managed Care, Other (non HMO) | Admitting: Nurse Practitioner

## 2022-02-28 ENCOUNTER — Encounter (INDEPENDENT_AMBULATORY_CARE_PROVIDER_SITE_OTHER): Payer: Self-pay | Admitting: Gastroenterology

## 2022-03-04 ENCOUNTER — Encounter: Payer: Self-pay | Admitting: Nurse Practitioner

## 2022-03-04 ENCOUNTER — Ambulatory Visit: Payer: Managed Care, Other (non HMO) | Admitting: Nurse Practitioner

## 2022-03-04 VITALS — BP 101/69 | HR 74 | Ht 61.0 in | Wt 97.1 lb

## 2022-03-04 DIAGNOSIS — F988 Other specified behavioral and emotional disorders with onset usually occurring in childhood and adolescence: Secondary | ICD-10-CM

## 2022-03-04 DIAGNOSIS — F321 Major depressive disorder, single episode, moderate: Secondary | ICD-10-CM

## 2022-03-04 MED ORDER — AMPHETAMINE-DEXTROAMPHETAMINE 10 MG PO TABS: 10.0000 mg | ORAL_TABLET | Freq: Two times a day (BID) | ORAL | 0 refills | Status: DC | PRN

## 2022-03-04 MED ORDER — FLUOXETINE HCL 20 MG PO TABS: 20.0000 mg | ORAL_TABLET | Freq: Every day | ORAL | 1 refills | Status: DC

## 2022-03-04 NOTE — Progress Notes (Signed)
Established patient visit   Patient: Tina Ramirez   DOB: August 08, 1975   46 y.o. Female  MRN: [id number] Visit Date: 03/04/2022   Chief Complaint  Patient presents with   Follow-up   Subjective    HPI  Routine follow up for ADHD.  -currently on dose adderall 10 mg twice daily as needed  -feels like this 10 mg dose is adequate and helping her to stay focused and on track at home and at work. She denies negative side effects.  -She states that appetite is not reduced. Though underweight, her weight has remained stable.  -she states that she is also doing well with current dose of fluoxetine.  -the patient states that the combination of the two medications has really changed her life for the better.  - PDMP profile 02/18/2022 - overdose risk score is 190.  No red flags  or state indicators noted.   Medications: Outpatient Medications Prior to Visit  Medication Sig   levonorgestrel (MIRENA, 52 MG,) 20 MCG/DAY IUD Take 1 device by intrauterine route.   [DISCONTINUED] amphetamine-dextroamphetamine (ADDERALL) 10 MG tablet Take 1 tablet (10 mg total) by mouth 2 (two) times daily as needed.   [DISCONTINUED] FLUoxetine (PROZAC) 20 MG tablet Take 1 tablet (20 mg total) by mouth daily.   [DISCONTINUED] amoxicillin-clavulanate (AUGMENTIN) 875-125 MG tablet Take 1 tablet by mouth 2 (two) times daily.   No facility-administered medications prior to visit.    Review of Systems  Constitutional:  Negative for activity change, appetite change, chills, fatigue and fever.  HENT:  Negative for congestion, postnasal drip, rhinorrhea, sinus pressure, sinus pain, sneezing and sore throat.   Eyes: Negative.   Respiratory:  Negative for cough, chest tightness, shortness of breath and wheezing.   Cardiovascular:  Negative for chest pain and palpitations.  Gastrointestinal:  Negative for abdominal pain, constipation, diarrhea, nausea and vomiting.  Endocrine: Negative for cold intolerance, heat intolerance,  polydipsia and polyuria.  Genitourinary:  Positive for difficulty urinating, frequency and urgency. Negative for dyspareunia, dysuria and flank pain.  Musculoskeletal:  Negative for arthralgias, back pain and myalgias.  Skin:  Negative for rash.  Allergic/Immunologic: Negative for environmental allergies.  Neurological:  Negative for dizziness, weakness and headaches.  Hematological:  Negative for adenopathy.  Psychiatric/Behavioral:  Positive for decreased concentration and dysphoric mood. The patient is not nervous/anxious.        Doing well with current doses of fluoxetine and adderall.        Objective     Today's Vitals   03/04/22 0822  BP: 101/69  Pulse: 74  SpO2: 100%  Weight: 97 lb 1.9 oz (44.1 kg)  Height: 5\' 1"  (1.549 m)   Body mass index is 18.35 kg/m.  BP Readings from Last 3 Encounters:  03/04/22 101/69  11/20/21 104/69  10/27/21 94/60    Wt Readings from Last 3 Encounters:  03/04/22 97 lb 1.9 oz (44.1 kg)  11/20/21 97 lb (44 kg)  10/27/21 97 lb 1.9 oz (44.1 kg)    Physical Exam Vitals and nursing note reviewed.  Constitutional:      Appearance: Normal appearance. She is well-developed.  HENT:     Head: Normocephalic and atraumatic.     Mouth/Throat:     Mouth: Mucous membranes are moist.     Pharynx: Oropharynx is clear.  Eyes:     Extraocular Movements: Extraocular movements intact.     Conjunctiva/sclera: Conjunctivae normal.     Pupils: Pupils are equal, round, and reactive to light.  Cardiovascular:     Rate and Rhythm: Normal rate and regular rhythm.     Pulses: Normal pulses.     Heart sounds: Normal heart sounds.  Pulmonary:     Effort: Pulmonary effort is normal.     Breath sounds: Normal breath sounds.  Abdominal:     Palpations: Abdomen is soft.  Musculoskeletal:        General: Normal range of motion.     Cervical back: Normal range of motion and neck supple.  Lymphadenopathy:     Cervical: No cervical adenopathy.  Skin:     General: Skin is warm and dry.     Capillary Refill: Capillary refill takes less than 2 seconds.  Neurological:     General: No focal deficit present.     Mental Status: She is alert and oriented to person, place, and time.  Psychiatric:        Mood and Affect: Mood normal.        Behavior: Behavior normal.        Thought Content: Thought content normal.        Judgment: Judgment normal.       Assessment & Plan    1. Depression, major, single episode, moderate (HCC) Stable. Continue fluoxetine 20 mg daily  - FLUoxetine (PROZAC) 20 MG tablet; Take 1 tablet (20 mg total) by mouth daily.  Dispense: 90 tablet; Refill: 1  2. Adult attention deficit disorder Renew adderall 10 mg up to twice daily when needed. Three 30 day prescriptions were sent to her pharmacy today. Dates are 03/04/2022, 04/01/2022, and 04/30/2022.  - amphetamine-dextroamphetamine (ADDERALL) 10 MG tablet; Take 1 tablet (10 mg total) by mouth 2 (two) times daily as needed.  Dispense: 60 tablet; Refill: 0   Problem List Items Addressed This Visit       Other   Depression, major, single episode, moderate (HCC) - Primary   Relevant Medications   FLUoxetine (PROZAC) 20 MG tablet   Adult attention deficit disorder   Relevant Medications   amphetamine-dextroamphetamine (ADDERALL) 10 MG tablet     Return in about 3 months (around 06/04/2022) for mood, ADD.         Carlean Jews, NP  [facility] at [facility] [phone] (phone) [phone] (fax)  [facility]

## 2022-05-24 IMAGING — DX DG CHEST 2V
2 series · 2 of 2 positions shown · non-contrast
Comparison: 03/22/2020

CLINICAL DATA: Cough, shortness of breath, COVID, dyspnea on
exertion

EXAM:
CHEST - 2 VIEW

[chest pa]
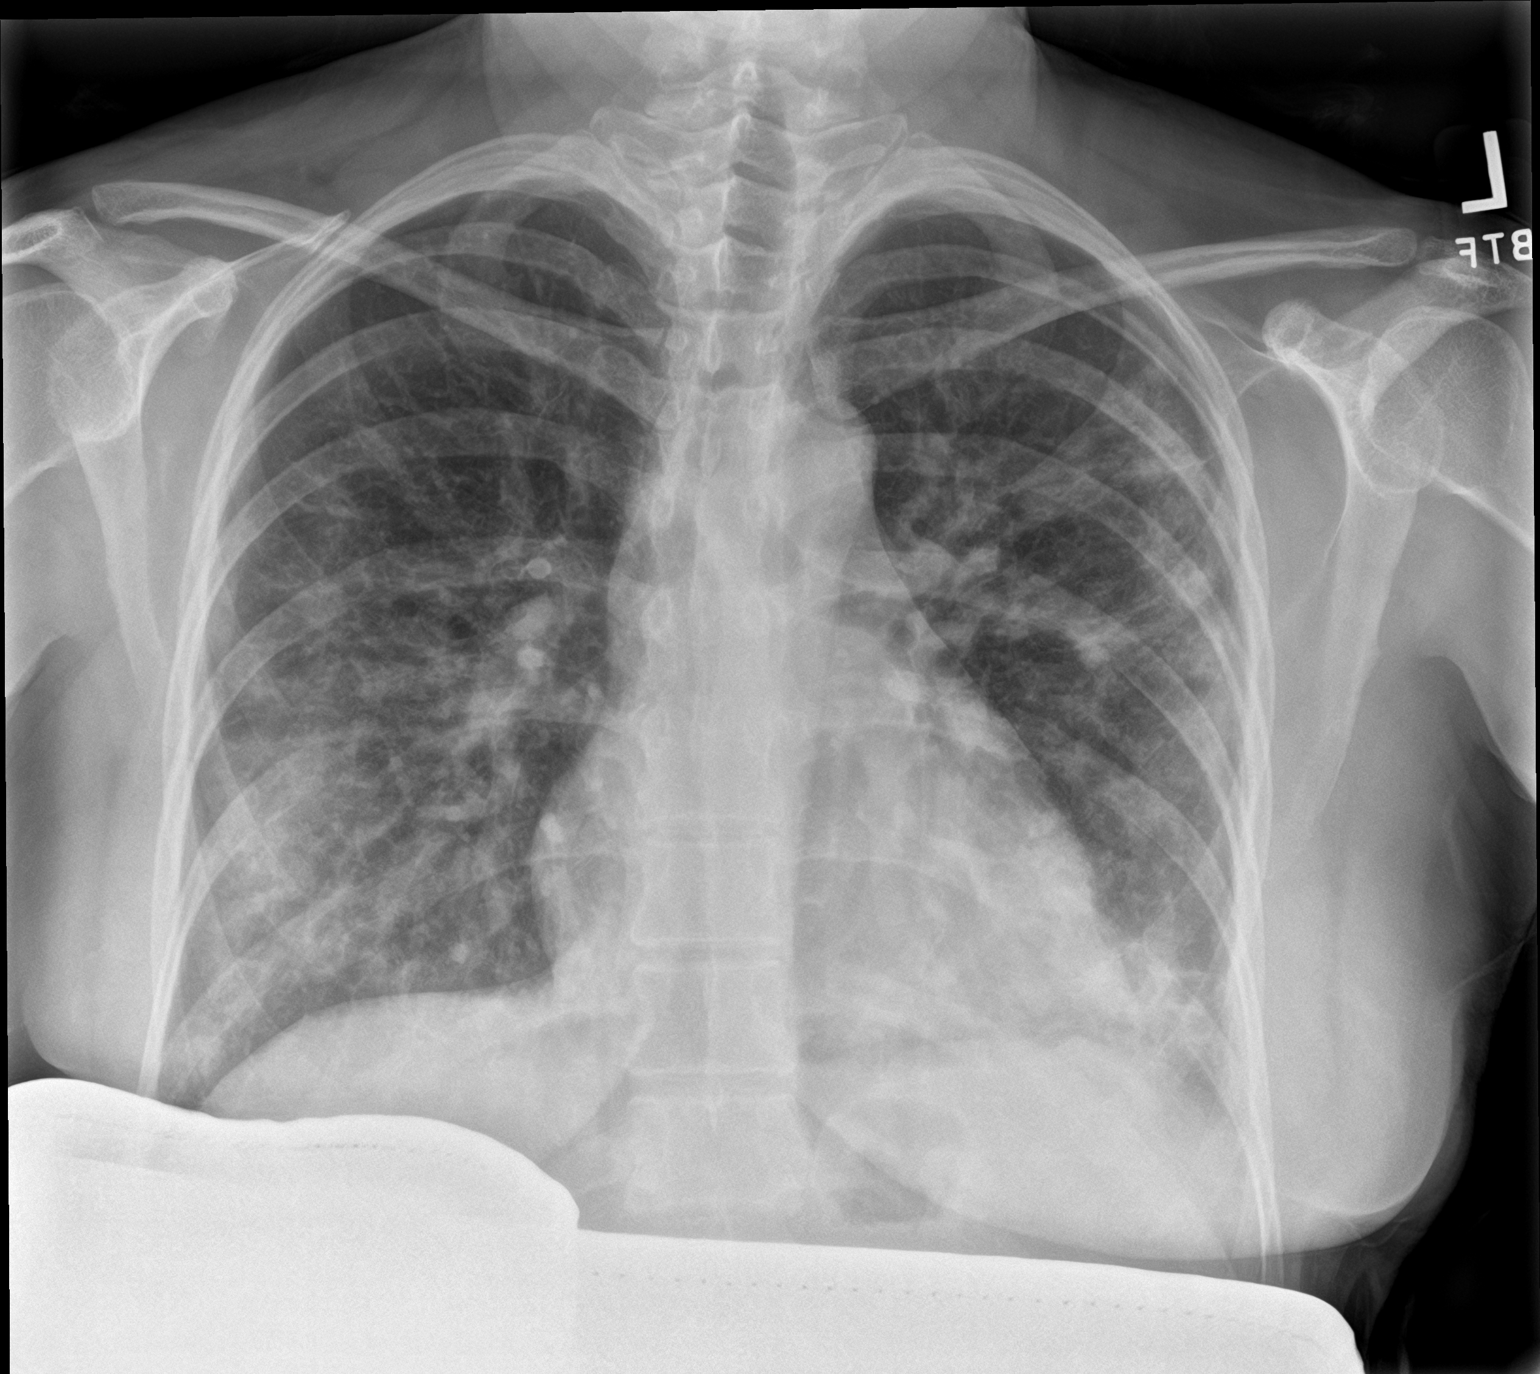

[chest lat]
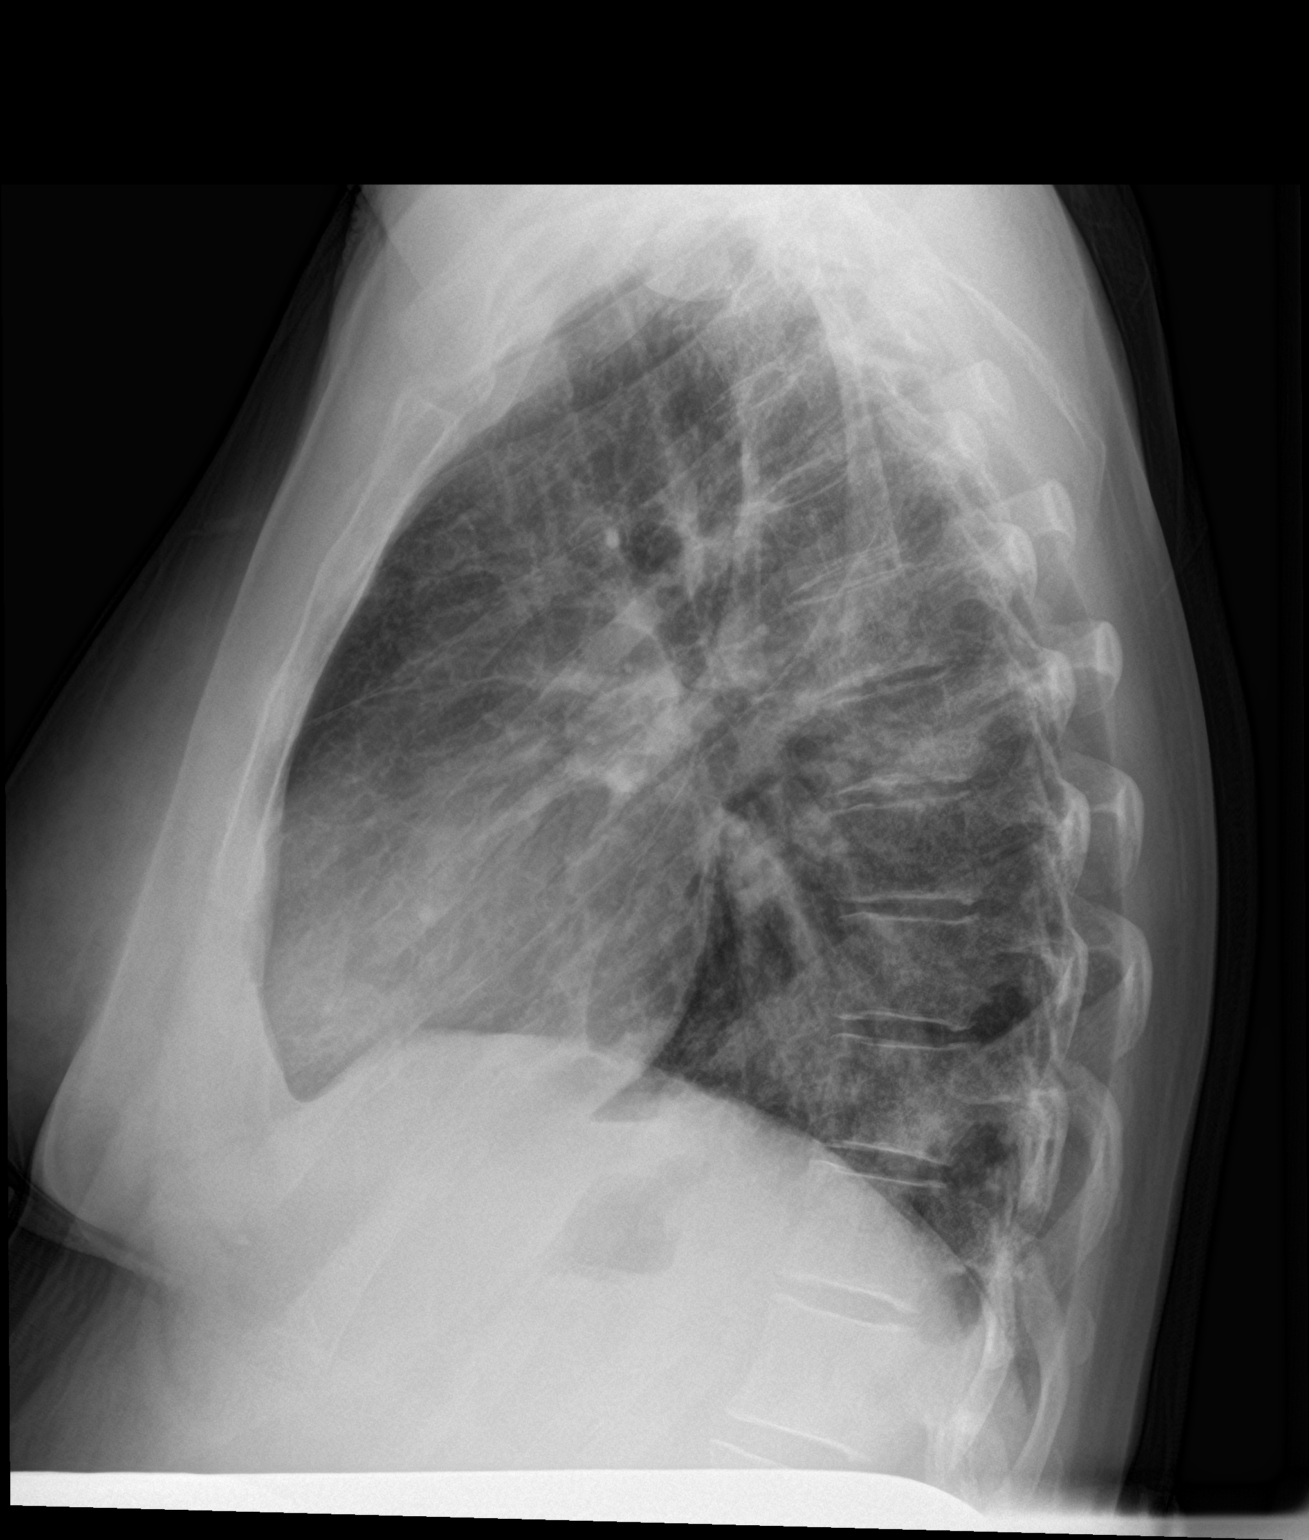

[2 of 2 positions shown; findings below may reference images not displayed]

FINDINGS: Subpleural patchy opacities in the lungs bilaterally, lower lobe
predominant with relative sparing of the right upper lobe,
progressive. No pleural effusion or pneumothorax.

The heart is normal in size.

Visualized osseous structures are within normal limits.
IMPRESSION: Progressive multifocal pneumonia in this patient with known COVID.

## 2022-06-07 ENCOUNTER — Ambulatory Visit: Payer: Managed Care, Other (non HMO) | Admitting: Nurse Practitioner

## 2022-08-18 ENCOUNTER — Ambulatory Visit: Payer: Managed Care, Other (non HMO) | Admitting: Nurse Practitioner

## 2022-09-06 ENCOUNTER — Ambulatory Visit (INDEPENDENT_AMBULATORY_CARE_PROVIDER_SITE_OTHER): Payer: Managed Care, Other (non HMO) | Admitting: Family Medicine

## 2022-09-06 ENCOUNTER — Encounter: Payer: Self-pay | Admitting: Family Medicine

## 2022-09-06 VITALS — BP 118/80 | HR 83 | Resp 18 | Ht 61.0 in | Wt 112.0 lb

## 2022-09-06 DIAGNOSIS — F321 Major depressive disorder, single episode, moderate: Secondary | ICD-10-CM

## 2022-09-06 DIAGNOSIS — F411 Generalized anxiety disorder: Secondary | ICD-10-CM

## 2022-09-06 DIAGNOSIS — F988 Other specified behavioral and emotional disorders with onset usually occurring in childhood and adolescence: Secondary | ICD-10-CM | POA: Diagnosis not present

## 2022-09-06 MED ORDER — AMPHETAMINE-DEXTROAMPHETAMINE 10 MG PO TABS: 10.0000 mg | ORAL_TABLET | Freq: Two times a day (BID) | ORAL | 0 refills | Status: DC | PRN

## 2022-09-06 MED ORDER — FLUOXETINE HCL 20 MG PO TABS: 20.0000 mg | ORAL_TABLET | Freq: Every day | ORAL | 1 refills | Status: DC

## 2022-09-06 NOTE — Assessment & Plan Note (Signed)
PHQ-9 score of 1, GAD-7 of 2.  Stable.  Continue Prozac 20 mg daily.  Will continue to monitor. 

## 2022-09-06 NOTE — Progress Notes (Signed)
Established Patient Office Visit  Subjective   Patient ID: Tina Ramirez, female    DOB: 05-29-1975  Age: 47 y.o. MRN: [id number]  Chief Complaint  Patient presents with   ADD   Anxiety   Depression    HPI Tina Ramirez is a 47 y.o. female presenting today for follow up of ADHD, mood. ADHD: Reports symptoms are well controlled on Adderall 10 mg twice daily. Denies any problems with insomnia, chest pain, palpitations, or SOB.   Mood: Patient is here to follow up for depression and anxiety, currently managing with Prozac 20 mg daily. Taking medication without side effects, reports excellent compliance with treatment. Denies mood changes or SI/HI. She feels mood is stable since last visit. Denies chest pain, difficulty concentrating, dizziness, fatigue, insomnia, irritability, palpitations, panic attacks, racing thoughts, SOB, sweating. Denies anhedonia, depressed mood, difficulty concentrating, fatigue, feelings of worthlessness/guilt, hopelessness, hypersomnia, impaired memory, insomnia, psychomotor agitation, psychomotor retardation, recurrent thoughts of death, weight changes.     Sep 10, 2022    4:00 PM 03/04/2022    8:24 AM 10/27/2021    9:22 AM  Depression screen PHQ 2/9  Decreased Interest 0 0 0  Down, Depressed, Hopeless 0 1 0  PHQ - 2 Score 0 1 0  Altered sleeping 1 0 0  Tired, decreased energy 0 0 0  Change in appetite 0 0 0  Feeling bad or failure about yourself  0 0 0  Trouble concentrating 0 0 1  Moving slowly or fidgety/restless 0 0 0  Suicidal thoughts 0 0 0  PHQ-9 Score 1 1 1   Difficult doing work/chores Not difficult at all         2022-09-10    4:01 PM 03/04/2022    8:24 AM 10/27/2021    9:22 AM 09/08/2021    3:45 PM  GAD 7 : Generalized Anxiety Score  Nervous, Anxious, on Edge 1 1 1 1   Control/stop worrying 0 0 0 1  Worry too much - different things 0 0 1 1  Trouble relaxing 0 0 0 1  Restless 0 0 0 0  Easily annoyed or irritable 1 0 0 1  Afraid - awful  might happen 0 0 0 0  Total GAD 7 Score 2 1 2 5   Anxiety Difficulty Not difficult at all      ROS Negative unless otherwise noted in HPI   Objective:     BP 118/80 (BP Location: Right Arm, Patient Position: Sitting, Cuff Size: Normal)   Pulse 83   Resp 18   Ht 5\' 1"  (1.549 m)   Wt 112 lb (50.8 kg)   SpO2 98%   BMI 21.16 kg/m   Physical Exam Constitutional:      General: She is not in acute distress.    Appearance: Normal appearance.  HENT:     Head: Normocephalic and atraumatic.  Cardiovascular:     Rate and Rhythm: Normal rate and regular rhythm.     Pulses: Normal pulses.     Heart sounds: No murmur heard.    No friction rub. No gallop.  Pulmonary:     Effort: Pulmonary effort is normal. No respiratory distress.     Breath sounds: No wheezing, rhonchi or rales.  Skin:    General: Skin is warm and dry.  Neurological:     Mental Status: She is alert and oriented to person, place, and time.     Assessment & Plan:  Depression, major, single episode, moderate (HCC) Assessment & Plan:  PHQ-9 score of 1, GAD-7 of 2.  Stable.  Continue Prozac 20 mg daily.  Will continue to monitor.  Orders: -     FLUoxetine HCl; Take 1 tablet (20 mg total) by mouth daily.  Dispense: 90 tablet; Refill: 1  Generalized anxiety disorder Assessment & Plan: PHQ-9 score of 1, GAD-7 of 2.  Stable.  Continue Prozac 20 mg daily.  Will continue to monitor.  Orders: -     FLUoxetine HCl; Take 1 tablet (20 mg total) by mouth daily.  Dispense: 90 tablet; Refill: 1  Adult attention deficit disorder Assessment & Plan: Stable, controlled.  Continue Adderall 10 mg twice daily.  Will continue to monitor.  Orders: -     Amphetamine-Dextroamphetamine; Take 1 tablet (10 mg total) by mouth 2 (two) times daily as needed.  Dispense: 60 tablet; Refill: 0 -     Amphetamine-Dextroamphetamine; Take 1 tablet (10 mg total) by mouth 2 (two) times daily as needed.  Dispense: 60 tablet; Refill: 0 -      Amphetamine-Dextroamphetamine; Take 1 tablet (10 mg total) by mouth 2 (two) times daily as needed.  Dispense: 60 tablet; Refill: 0  Most recent CMP within normal limits.  Return in about 3 months (around 12/07/2022) for annual physical, fasting blood work 1 week before.    Melida Quitter, PA

## 2022-09-06 NOTE — Assessment & Plan Note (Addendum)
Stable, controlled.  Continue Adderall 10 mg twice daily.  Will continue to monitor.

## 2022-09-06 NOTE — Assessment & Plan Note (Addendum)
PHQ-9 score of 1, GAD-7 of 2.  Stable.  Continue Prozac 20 mg daily.  Will continue to monitor.

## 2022-09-07 ENCOUNTER — Ambulatory Visit: Payer: Managed Care, Other (non HMO) | Admitting: Nurse Practitioner

## 2022-10-26 LAB — HM PAP SMEAR
HM Pap smear: NEGATIVE
HPV, high-risk: NEGATIVE

## 2022-10-27 NOTE — Progress Notes (Signed)
Negative pap smear  fungal organisms present

## 2022-11-23 ENCOUNTER — Other Ambulatory Visit: Payer: Self-pay | Admitting: Family Medicine

## 2022-11-23 DIAGNOSIS — Z0000 Encounter for general adult medical examination without abnormal findings: Secondary | ICD-10-CM

## 2022-12-01 ENCOUNTER — Other Ambulatory Visit: Payer: Managed Care, Other (non HMO)

## 2022-12-07 ENCOUNTER — Encounter: Payer: Managed Care, Other (non HMO) | Admitting: Family Medicine

## 2023-01-25 ENCOUNTER — Encounter: Payer: Self-pay | Admitting: Family Medicine

## 2023-02-07 ENCOUNTER — Telehealth: Payer: Self-pay | Admitting: *Deleted

## 2023-02-07 NOTE — Telephone Encounter (Signed)
Opened in error

## 2023-02-10 ENCOUNTER — Encounter: Payer: Self-pay | Admitting: Family Medicine

## 2023-02-10 ENCOUNTER — Telehealth (INDEPENDENT_AMBULATORY_CARE_PROVIDER_SITE_OTHER): Payer: Managed Care, Other (non HMO) | Admitting: Family Medicine

## 2023-02-10 VITALS — Wt 102.0 lb

## 2023-02-10 DIAGNOSIS — Z1159 Encounter for screening for other viral diseases: Secondary | ICD-10-CM

## 2023-02-10 DIAGNOSIS — F988 Other specified behavioral and emotional disorders with onset usually occurring in childhood and adolescence: Secondary | ICD-10-CM

## 2023-02-10 DIAGNOSIS — Z1321 Encounter for screening for nutritional disorder: Secondary | ICD-10-CM

## 2023-02-10 DIAGNOSIS — Z131 Encounter for screening for diabetes mellitus: Secondary | ICD-10-CM

## 2023-02-10 DIAGNOSIS — F411 Generalized anxiety disorder: Secondary | ICD-10-CM | POA: Diagnosis not present

## 2023-02-10 DIAGNOSIS — Z1329 Encounter for screening for other suspected endocrine disorder: Secondary | ICD-10-CM

## 2023-02-10 DIAGNOSIS — F321 Major depressive disorder, single episode, moderate: Secondary | ICD-10-CM | POA: Diagnosis not present

## 2023-02-10 DIAGNOSIS — Z136 Encounter for screening for cardiovascular disorders: Secondary | ICD-10-CM

## 2023-02-10 MED ORDER — AMPHETAMINE-DEXTROAMPHETAMINE 10 MG PO TABS
10.0000 mg | ORAL_TABLET | Freq: Two times a day (BID) | ORAL | 0 refills | Status: DC | PRN
Start: 2023-02-10 — End: 2023-02-28

## 2023-02-10 MED ORDER — PROPRANOLOL HCL 10 MG PO TABS
10.0000 mg | ORAL_TABLET | Freq: Three times a day (TID) | ORAL | 1 refills | Status: DC | PRN
Start: 2023-02-10 — End: 2024-02-07

## 2023-02-10 MED ORDER — AMPHETAMINE-DEXTROAMPHETAMINE 10 MG PO TABS
10.0000 mg | ORAL_TABLET | Freq: Two times a day (BID) | ORAL | 0 refills | Status: DC | PRN
Start: 2023-04-11 — End: 2023-04-19

## 2023-02-10 MED ORDER — AMPHETAMINE-DEXTROAMPHETAMINE 10 MG PO TABS
10.0000 mg | ORAL_TABLET | Freq: Two times a day (BID) | ORAL | 0 refills | Status: DC | PRN
Start: 2023-03-12 — End: 2023-04-19

## 2023-02-10 MED ORDER — FLUOXETINE HCL 40 MG PO CAPS: 40.0000 mg | ORAL_CAPSULE | Freq: Every day | ORAL | 1 refills | Status: DC

## 2023-02-10 NOTE — Progress Notes (Signed)
Virtual Visit via Video Note  I connected with Tina Ramirez on 02/10/23 at 10:10 AM EDT by a video enabled telemedicine application and verified that I am speaking with the correct person using two identifiers.  Patient Location: Home Provider Location: Office/clinic  I discussed the limitations, risks, security, and privacy concerns of performing an evaluation and management service by video and the availability of in person appointments. I also discussed with the patient that there may be a patient responsible charge related to this service. The patient expressed understanding and agreed to proceed.    Subjective   Patient ID: Tina Ramirez, female    DOB: 1975-06-23  Age: 47 y.o. MRN: [id number]  Chief Complaint  Patient presents with   Anxiety   Depression    HPI Tina Ramirez is a 47 y.o. female presenting today for follow up of ADHD, mood. ADHD: Reports symptoms are well controlled on Adderall for the most part. Denies any problems with insomnia, chest pain, palpitations, or SOB.   Mood: Patient is here to follow up for depression and anxiety, currently managing with Prozac 20 mg daily. Taking medication without side effects, reports excellent compliance with treatment. Denies mood changes or SI/HI. She feels mood is worse since last visit.  Increased stress has worsened her depression and anxiety and she feels that the Prozac dosage is no longer helping.  She is interested in increasing the dose even temporarily as she has had to do this in the past with increased stress and found it helpful.  She has also had several anxiety attacks and is interested in rescue medication but does not want to take something like Xanax.     02/10/2023   10:05 AM 09/06/2022    4:00 PM 03/04/2022    8:24 AM  Depression screen PHQ 2/9  Decreased Interest 0 0 0  Down, Depressed, Hopeless 3 0 1  PHQ - 2 Score 3 0 1  Altered sleeping 3 1 0  Tired, decreased energy 3 0 0  Change in appetite 3 0 0   Feeling bad or failure about yourself  0 0 0  Trouble concentrating 3 0 0  Moving slowly or fidgety/restless 0 0 0  Suicidal thoughts 0 0 0  PHQ-9 Score 15 1 1   Difficult doing work/chores Somewhat difficult Not difficult at all        02/10/2023   10:07 AM 09/06/2022    4:01 PM 03/04/2022    8:24 AM 10/27/2021    9:22 AM  GAD 7 : Generalized Anxiety Score  Nervous, Anxious, on Edge 3 1 1 1   Control/stop worrying 3 0 0 0  Worry too much - different things 3 0 0 1  Trouble relaxing 3 0 0 0  Restless 0 0 0 0  Easily annoyed or irritable 3 1 0 0  Afraid - awful might happen 0 0 0 0  Total GAD 7 Score 15 2 1 2   Anxiety Difficulty Somewhat difficult Not difficult at all      ROS Negative unless otherwise noted in HPI  Outpatient Medications Prior to Visit  Medication Sig   levonorgestrel (MIRENA, 52 MG,) 20 MCG/DAY IUD Take 1 device by intrauterine route.   [DISCONTINUED] amphetamine-dextroamphetamine (ADDERALL) 10 MG tablet Take 1 tablet (10 mg total) by mouth 2 (two) times daily as needed.   [DISCONTINUED] amphetamine-dextroamphetamine (ADDERALL) 10 MG tablet Take 1 tablet (10 mg total) by mouth 2 (two) times daily as needed.   [DISCONTINUED]  amphetamine-dextroamphetamine (ADDERALL) 10 MG tablet Take 1 tablet (10 mg total) by mouth 2 (two) times daily as needed.   [DISCONTINUED] FLUoxetine (PROZAC) 20 MG tablet Take 1 tablet (20 mg total) by mouth daily.   No facility-administered medications prior to visit.     Objective:     Physical Exam General: Speaking clearly in complete sentences without any shortness of breath.  Alert and oriented x3.  Normal judgment. No apparent acute distress.   Assessment & Plan:  Adult attention deficit disorder Assessment & Plan: Stable, controlled.  PDMP reviewed, overdose risk score 260.  Continue Adderall 10 mg twice daily.  Will continue to monitor.  Orders: -     Amphetamine-Dextroamphetamine; Take 1 tablet (10 mg total) by mouth 2  (two) times daily as needed.  Dispense: 60 tablet; Refill: 0 -     Amphetamine-Dextroamphetamine; Take 1 tablet (10 mg total) by mouth 2 (two) times daily as needed.  Dispense: 60 tablet; Refill: 0 -     Amphetamine-Dextroamphetamine; Take 1 tablet (10 mg total) by mouth 2 (two) times daily as needed.  Dispense: 60 tablet; Refill: 0  Generalized anxiety disorder Assessment & Plan: Uncontrolled due to recent increase in stress.  Increase to Prozac 40 mg daily, follow-up at next scheduled appointment.  Provided propranolol as rescue medication for breakthrough anxiety/panic attacks.  Orders: -     FLUoxetine HCl; Take 1 capsule (40 mg total) by mouth daily.  Dispense: 90 capsule; Refill: 1 -     Propranolol HCl; Take 1 tablet (10 mg total) by mouth 3 (three) times daily as needed.  Dispense: 30 tablet; Refill: 1  Depression, major, single episode, moderate (HCC) Assessment & Plan: Uncontrolled.  Increase Prozac to 40 mg daily.  Follow-up at next appointment.  Orders: -     FLUoxetine HCl; Take 1 capsule (40 mg total) by mouth daily.  Dispense: 90 capsule; Refill: 1    Return in about 6 weeks (around 03/24/2023), or as scheduled, for follow-up for mood after increasing Prozac, in person or video.   I discussed the assessment and treatment plan with the patient. The patient was provided an opportunity to ask questions, and all were answered. The patient agreed with the plan and demonstrated an understanding of the instructions.   The patient was advised to call back or seek an in-person evaluation if the symptoms worsen or if the condition fails to improve as anticipated.  The above assessment and management plan was discussed with the patient. The patient verbalized understanding of and has agreed to the management plan.   Melida Quitter, PA

## 2023-02-10 NOTE — Assessment & Plan Note (Signed)
Stable, controlled.  PDMP reviewed, overdose risk score 260.  Continue Adderall 10 mg twice daily.  Will continue to monitor.

## 2023-02-10 NOTE — Assessment & Plan Note (Signed)
Uncontrolled due to recent increase in stress.  Increase to Prozac 40 mg daily, follow-up at next scheduled appointment.  Provided propranolol as rescue medication for breakthrough anxiety/panic attacks.

## 2023-02-10 NOTE — Assessment & Plan Note (Signed)
Uncontrolled.  Increase Prozac to 40 mg daily.  Follow-up at next appointment.

## 2023-02-22 ENCOUNTER — Other Ambulatory Visit: Payer: Managed Care, Other (non HMO)

## 2023-02-22 DIAGNOSIS — Z131 Encounter for screening for diabetes mellitus: Secondary | ICD-10-CM

## 2023-02-22 DIAGNOSIS — F321 Major depressive disorder, single episode, moderate: Secondary | ICD-10-CM

## 2023-02-22 DIAGNOSIS — Z1329 Encounter for screening for other suspected endocrine disorder: Secondary | ICD-10-CM

## 2023-02-22 DIAGNOSIS — Z1321 Encounter for screening for nutritional disorder: Secondary | ICD-10-CM

## 2023-02-22 DIAGNOSIS — Z136 Encounter for screening for cardiovascular disorders: Secondary | ICD-10-CM

## 2023-02-22 DIAGNOSIS — Z1159 Encounter for screening for other viral diseases: Secondary | ICD-10-CM

## 2023-02-22 DIAGNOSIS — F411 Generalized anxiety disorder: Secondary | ICD-10-CM

## 2023-02-23 LAB — COMPREHENSIVE METABOLIC PANEL
ALT: 13 [IU]/L (ref 0–32)
AST: 19 [IU]/L (ref 0–40)
Albumin: 4.9 g/dL (ref 3.9–4.9)
Alkaline Phosphatase: 64 [IU]/L (ref 44–121)
BUN/Creatinine Ratio: 10 (ref 9–23)
BUN: 8 mg/dL (ref 6–24)
Bilirubin Total: 0.6 mg/dL (ref 0.0–1.2)
CO2: 25 mmol/L (ref 20–29)
Calcium: 9.6 mg/dL (ref 8.7–10.2)
Chloride: 101 mmol/L (ref 96–106)
Creatinine, Ser: 0.8 mg/dL (ref 0.57–1.00)
Globulin, Total: 2.5 g/dL (ref 1.5–4.5)
Glucose: 102 mg/dL — ABNORMAL HIGH (ref 70–99)
Potassium: 4.6 mmol/L (ref 3.5–5.2)
Sodium: 142 mmol/L (ref 134–144)
Total Protein: 7.4 g/dL (ref 6.0–8.5)
eGFR: 93 mL/min/{1.73_m2} (ref >59)

## 2023-02-23 LAB — CBC WITH DIFFERENTIAL/PLATELET
Basophils Absolute: 0 10*3/uL (ref 0.0–0.2)
Basos: 1 %
EOS (ABSOLUTE): 0.1 10*3/uL (ref 0.0–0.4)
Eos: 2 %
Hematocrit: 39.6 % (ref 34.0–46.6)
Hemoglobin: 13 g/dL (ref 11.1–15.9)
Immature Grans (Abs): 0 10*3/uL (ref 0.0–0.1)
Immature Granulocytes: 1 %
Lymphocytes Absolute: 2.2 10*3/uL (ref 0.7–3.1)
Lymphs: 37 %
MCH: 31.8 pg (ref 26.6–33.0)
MCHC: 32.8 g/dL (ref 31.5–35.7)
MCV: 97 fL (ref 79–97)
Monocytes Absolute: 0.4 10*3/uL (ref 0.1–0.9)
Monocytes: 7 %
Neutrophils Absolute: 3.1 10*3/uL (ref 1.4–7.0)
Neutrophils: 52 %
Platelets: 294 10*3/uL (ref 150–450)
RBC: 4.09 x10E6/uL (ref 3.77–5.28)
RDW: 12.4 % (ref 11.7–15.4)
WBC: 5.9 10*3/uL (ref 3.4–10.8)

## 2023-02-23 LAB — LIPID PANEL
Chol/HDL Ratio: 2.1 ratio (ref 0.0–4.4)
Cholesterol, Total: 145 mg/dL (ref 100–199)
HDL: 68 mg/dL (ref >39)
LDL Chol Calc (NIH): 63 mg/dL (ref 0–99)
Triglycerides: 69 mg/dL (ref 0–149)
VLDL Cholesterol Cal: 14 mg/dL (ref 5–40)

## 2023-02-23 LAB — TSH RFX ON ABNORMAL TO FREE T4: TSH: 1.38 u[IU]/mL (ref 0.450–4.500)

## 2023-02-23 LAB — VITAMIN D 25 HYDROXY (VIT D DEFICIENCY, FRACTURES): Vit D, 25-Hydroxy: 66.3 ng/mL (ref 30.0–100.0)

## 2023-02-23 LAB — HEMOGLOBIN A1C
Est. average glucose Bld gHb Est-mCnc: 108 mg/dL
Hgb A1c MFr Bld: 5.4 % (ref 4.8–5.6)

## 2023-02-23 LAB — HEPATITIS C ANTIBODY: Hep C Virus Ab: NONREACTIVE

## 2023-02-28 ENCOUNTER — Ambulatory Visit (INDEPENDENT_AMBULATORY_CARE_PROVIDER_SITE_OTHER): Payer: Managed Care, Other (non HMO) | Admitting: Family Medicine

## 2023-02-28 ENCOUNTER — Encounter: Payer: Self-pay | Admitting: Family Medicine

## 2023-02-28 VITALS — BP 118/77 | HR 77 | Resp 18 | Ht 61.0 in | Wt 104.0 lb

## 2023-02-28 DIAGNOSIS — Z0000 Encounter for general adult medical examination without abnormal findings: Secondary | ICD-10-CM

## 2023-02-28 DIAGNOSIS — Z1211 Encounter for screening for malignant neoplasm of colon: Secondary | ICD-10-CM

## 2023-02-28 DIAGNOSIS — F411 Generalized anxiety disorder: Secondary | ICD-10-CM | POA: Diagnosis not present

## 2023-02-28 DIAGNOSIS — F321 Major depressive disorder, single episode, moderate: Secondary | ICD-10-CM

## 2023-02-28 DIAGNOSIS — Z1212 Encounter for screening for malignant neoplasm of rectum: Secondary | ICD-10-CM

## 2023-02-28 DIAGNOSIS — B001 Herpesviral vesicular dermatitis: Secondary | ICD-10-CM

## 2023-02-28 DIAGNOSIS — F988 Other specified behavioral and emotional disorders with onset usually occurring in childhood and adolescence: Secondary | ICD-10-CM

## 2023-02-28 MED ORDER — AMPHETAMINE-DEXTROAMPHETAMINE 10 MG PO TABS: 10.0000 mg | ORAL_TABLET | Freq: Two times a day (BID) | ORAL | 0 refills | Status: DC | PRN

## 2023-02-28 MED ORDER — VALACYCLOVIR HCL 500 MG PO TABS: 500.0000 mg | ORAL_TABLET | Freq: Two times a day (BID) | ORAL | 1 refills | Status: AC | PRN

## 2023-02-28 MED ORDER — OLANZAPINE 2.5 MG PO TABS: 2.5000 mg | ORAL_TABLET | Freq: Every day | ORAL | 1 refills | Status: DC

## 2023-02-28 NOTE — Assessment & Plan Note (Signed)
GAD-7 score 10.  Continue Prozac 40 mg daily, add olanzapine 2.5 mg daily as adjunct.  Continue propranolol as needed for breakthrough anxiety/panic attacks.

## 2023-02-28 NOTE — Patient Instructions (Signed)
We are going to add a medication temporarily called olanzapine.  A very low dose helps your brain to know what to do with the serotonin that has been increased Prozac.  I am optimistic that this will be helpful during the time of your that you know is more difficult, and that we will have the option to come back off of it sometime in the spring.

## 2023-02-28 NOTE — Progress Notes (Signed)
Complete physical exam  Patient: Tina Ramirez   DOB: [birth year]   47 y.o. Female  MRN: [id number]  Subjective:    Chief Complaint  Patient presents with   Annual Exam    Tina Ramirez is a 47 y.o. female who presents today for a complete physical exam. She reports consuming a general diet.  She tries to take a break to walk periodically at work, and she stays very active with her 28-year-old at home in the evenings.  She generally feels fairly well physically, though she notes that her depression and anxiety tend to worsen this time of year.  She notes that initially increasing Prozac from 20 mg to 40 mg was helpful, but it is no longer helpful.  She does not have additional problems to discuss today.  She is requesting a refill of valacyclovir for occasional fever blister breakouts.   Most recent fall risk assessment:    09/06/2022    4:01 PM  Fall Risk   Falls in the past year? 0  Number falls in past yr: 0  Injury with Fall? 0  Risk for fall due to : No Fall Risks  Follow up Falls evaluation completed     Most recent depression and anxiety screenings:    02/28/2023    4:21 PM 02/10/2023   10:05 AM  PHQ 2/9 Scores  PHQ - 2 Score 4 3  PHQ- 9 Score 11 15      02/28/2023    4:21 PM 02/10/2023   10:07 AM 09/06/2022    4:01 PM 03/04/2022    8:24 AM  GAD 7 : Generalized Anxiety Score  Nervous, Anxious, on Edge 1 3 1 1   Control/stop worrying 2 3 0 0  Worry too much - different things 2 3 0 0  Trouble relaxing 2 3 0 0  Restless 1 0 0 0  Easily annoyed or irritable 2 3 1  0  Afraid - awful might happen 0 0 0 0  Total GAD 7 Score 10 15 2 1   Anxiety Difficulty Somewhat difficult Somewhat difficult Not difficult at all     Patient Active Problem List   Diagnosis Date Noted   Underweight 10/27/2021   Adult attention deficit disorder 08/10/2021   Depression, major, single episode, moderate (HCC) 08/02/2021   Generalized anxiety disorder 08/02/2012   Insomnia secondary to  anxiety 08/02/2012    Past Surgical History:  Procedure Laterality Date   ADENOIDECTOMY     CESAREAN SECTION     x 2   CESAREAN SECTION N/A 02/03/2016   Procedure: CESAREAN SECTION;  Surgeon: Candice Camp, MD;  Location: [facility];  Service: Obstetrics;  Laterality: N/A;  Repeat edc 02/09/16 NKDA Needs RNFA   CESAREAN SECTION N/A 05/28/2020   Procedure: REPEAT CESAREAN SECTION PREVIOUS X 2 EDC: 06-26-20 ALLERGIES: NKDA;  Surgeon: Harold Hedge, MD;  Location: [facility] LD ORS;  Service: Obstetrics;  Laterality: N/A;   cold knife conization  2007   ESOPHAGOGASTRODUODENOSCOPY  10/06/2011   Procedure: ESOPHAGOGASTRODUODENOSCOPY (EGD);  Surgeon: Malissa Hippo, MD;  Location: [facility] ENDO SUITE;  Service: Endoscopy;  Laterality: N/A;  310   HAND SURGERY     right x 2    MOUTH SURGERY     OOPHORECTOMY Left 05/28/2020   Procedure: OOPHORECTOMY;  Surgeon: Harold Hedge, MD;  Location: [facility] LD ORS;  Service: Obstetrics;  Laterality: Left;   Social History   Tobacco Use   Smoking status: Never    Passive exposure:  Never   Smokeless tobacco: Never  Vaping Use   Vaping status: Never Used  Substance Use Topics   Alcohol use: Yes   Drug use: No   Family History  Problem Relation Age of Onset   Asthma Son    Transient ischemic attack Mother    No Known Allergies   Patient Care Team: Melida Quitter, PA as PCP - General (Family Medicine)   Outpatient Medications Prior to Visit  Medication Sig   [START ON 03/12/2023] amphetamine-dextroamphetamine (ADDERALL) 10 MG tablet Take 1 tablet (10 mg total) by mouth 2 (two) times daily as needed.   [START ON 04/11/2023] amphetamine-dextroamphetamine (ADDERALL) 10 MG tablet Take 1 tablet (10 mg total) by mouth 2 (two) times daily as needed.   FLUoxetine (PROZAC) 40 MG capsule Take 1 capsule (40 mg total) by mouth daily.   levonorgestrel (MIRENA, 52 MG,) 20 MCG/DAY IUD Take 1 device by intrauterine route.   propranolol (INDERAL) 10 MG tablet Take 1  tablet (10 mg total) by mouth 3 (three) times daily as needed.   [DISCONTINUED] amphetamine-dextroamphetamine (ADDERALL) 10 MG tablet Take 1 tablet (10 mg total) by mouth 2 (two) times daily as needed.   No facility-administered medications prior to visit.    Review of Systems  Constitutional:  Negative for chills, fever and malaise/fatigue.  HENT:  Negative for congestion and hearing loss.   Eyes:  Negative for blurred vision and double vision.  Respiratory:  Negative for cough and shortness of breath.   Cardiovascular:  Negative for chest pain, palpitations and leg swelling.  Gastrointestinal:  Negative for abdominal pain, constipation, diarrhea and heartburn.  Genitourinary:  Negative for frequency and urgency.  Musculoskeletal:  Negative for myalgias and neck pain.  Neurological:  Negative for headaches.  Endo/Heme/Allergies:  Negative for polydipsia.  Psychiatric/Behavioral:  Positive for depression. The patient is nervous/anxious.       Objective:    BP 118/77 (BP Location: Left Arm, Patient Position: Sitting, Cuff Size: Normal)   Pulse 77   Resp 18   Ht 5\' 1"  (1.549 m)   Wt 104 lb (47.2 kg)   LMP  (LMP Unknown)   SpO2 99%   Breastfeeding No   BMI 19.65 kg/m    Physical Exam Constitutional:      General: She is not in acute distress.    Appearance: Normal appearance.  HENT:     Head: Normocephalic and atraumatic.     Right Ear: Tympanic membrane, ear canal and external ear normal.     Left Ear: Tympanic membrane, ear canal and external ear normal.     Nose: Nose normal.     Mouth/Throat:     Mouth: Mucous membranes are moist.     Pharynx: No oropharyngeal exudate or posterior oropharyngeal erythema.  Eyes:     Extraocular Movements: Extraocular movements intact.     Conjunctiva/sclera: Conjunctivae normal.     Pupils: Pupils are equal, round, and reactive to light.     Comments: Not wearing glasses or contacts  Neck:     Thyroid: No thyroid mass, thyromegaly  or thyroid tenderness.  Cardiovascular:     Rate and Rhythm: Normal rate and regular rhythm.     Heart sounds: Normal heart sounds. No murmur heard.    No friction rub. No gallop.  Pulmonary:     Effort: Pulmonary effort is normal. No respiratory distress.     Breath sounds: Normal breath sounds. No wheezing, rhonchi or rales.  Abdominal:  General: Abdomen is flat. Bowel sounds are normal. There is no distension.     Palpations: There is no mass.     Tenderness: There is no abdominal tenderness. There is no guarding.  Musculoskeletal:        General: Normal range of motion.     Cervical back: Normal range of motion and neck supple.  Lymphadenopathy:     Cervical: No cervical adenopathy.  Skin:    General: Skin is warm and dry.  Neurological:     Mental Status: She is alert and oriented to person, place, and time.     Cranial Nerves: No cranial nerve deficit.     Motor: No weakness.     Deep Tendon Reflexes: Reflexes normal.  Psychiatric:        Mood and Affect: Mood normal.       Assessment & Plan:    Routine Health Maintenance and Physical Exam  Immunization History  Administered Date(s) Administered   Tdap 11/04/2015, 04/10/2020    Health Maintenance  Topic Date Due   Colonoscopy  Never done   Cervical Cancer Screening (HPV/Pap Cotest)  12/12/2022   COVID-19 Vaccine (1 - 2023-24 season) 03/16/2023 (Originally 12/19/2022)   INFLUENZA VACCINE  07/18/2023 (Originally 11/18/2022)   DTaP/Tdap/Td (3 - Td or Tdap) 04/10/2030   Hepatitis C Screening  Completed   HIV Screening  Completed   HPV VACCINES  Aged Out    Reviewed most recent labs including CBC, CMP, lipid panel, A1C, TSH, and vitamin D. All within normal limits. Cervical cancer screening due August 2024, sees [facility]. Agreeable to Cologuard after discussing colonoscopy and Cologuard options.  Discussed health benefits of physical activity, and encouraged her to engage in regular exercise  appropriate for her age and condition.  Wellness examination  Depression, major, single episode, moderate (HCC) Assessment & Plan: PHQ-9 score 11, PHQ 2 score 4.  Continue Prozac 40 mg daily, add olanzapine 2.5 mg daily as adjunct.  Will continue to monitor.  Orders: -     OLANZapine; Take 1 tablet (2.5 mg total) by mouth at bedtime.  Dispense: 90 tablet; Refill: 1  Generalized anxiety disorder Assessment & Plan: GAD-7 score 10.  Continue Prozac 40 mg daily, add olanzapine 2.5 mg daily as adjunct.  Continue propranolol as needed for breakthrough anxiety/panic attacks.  Orders: -     OLANZapine; Take 1 tablet (2.5 mg total) by mouth at bedtime.  Dispense: 90 tablet; Refill: 1  Adult attention deficit disorder Assessment & Plan: Stable, controlled.  PDMP reviewed, overdose risk score 260.  Continue Adderall 10 mg twice daily.  Will continue to monitor.  Orders: -     Amphetamine-Dextroamphetamine; Take 1 tablet (10 mg total) by mouth 2 (two) times daily as needed.  Dispense: 60 tablet; Refill: 0  Screening for colorectal cancer -     Cologuard  Fever blister -     valACYclovir HCl; Take 1 tablet (500 mg total) by mouth 2 (two) times daily as needed.  Dispense: 30 tablet; Refill: 1    Return in about 6 weeks (around 04/11/2023) for follow-up for mood, starting olanzapine, in person or video.     Melida Quitter, PA

## 2023-02-28 NOTE — Assessment & Plan Note (Signed)
Stable, controlled.  PDMP reviewed, overdose risk score 260.  Continue Adderall 10 mg twice daily.  Will continue to monitor.

## 2023-02-28 NOTE — Assessment & Plan Note (Signed)
PHQ-9 score 11, PHQ 2 score 4.  Continue Prozac 40 mg daily, add olanzapine 2.5 mg daily as adjunct.  Will continue to monitor.

## 2023-04-19 ENCOUNTER — Encounter: Payer: Self-pay | Admitting: Family Medicine

## 2023-04-19 ENCOUNTER — Telehealth: Payer: Managed Care, Other (non HMO) | Admitting: Family Medicine

## 2023-04-19 DIAGNOSIS — F321 Major depressive disorder, single episode, moderate: Secondary | ICD-10-CM

## 2023-04-19 DIAGNOSIS — F411 Generalized anxiety disorder: Secondary | ICD-10-CM

## 2023-04-19 DIAGNOSIS — F988 Other specified behavioral and emotional disorders with onset usually occurring in childhood and adolescence: Secondary | ICD-10-CM | POA: Diagnosis not present

## 2023-04-19 MED ORDER — AMPHETAMINE-DEXTROAMPHETAMINE 10 MG PO TABS: 10.0000 mg | ORAL_TABLET | Freq: Two times a day (BID) | ORAL | 0 refills | Status: AC | PRN

## 2023-04-19 NOTE — Progress Notes (Signed)
 Virtual Visit via Video Note  I connected with Tina Ramirez on 04/19/23 at  9:50 AM EST by a video enabled telemedicine application and verified that I am speaking with the correct person using two identifiers.  Patient Location: Home Provider Location: Office/clinic  I discussed the limitations, risks, security, and privacy concerns of performing an evaluation and management service by video and the availability of in person appointments. I also discussed with the patient that there may be a patient responsible charge related to this service. The patient expressed understanding and agreed to proceed.    Subjective   Patient ID: Tina Ramirez, female    DOB: [birth year]  Age: 47 y.o. MRN: [id number]  Chief Complaint  Patient presents with   Follow-up    HPI Tina Ramirez is a 47 y.o. female presenting today for follow up of depression and anxiety, currently managing with Prozac  40 mg daily and augmented with olanzapine  as added at last appointment. Taking medication without side effects, reports excellent compliance with treatment. Denies mood changes or SI/HI. She feels mood is improved since last visit but not entirely better. She notes that the holidays are particularly difficult and stressful, so she would like to continue with her current medication regimen for some additional time before making any adjustments.      04/19/2023    9:44 AM 02/28/2023    4:21 PM 02/10/2023   10:05 AM  Depression screen PHQ 2/9  Decreased Interest 0 2 0  Down, Depressed, Hopeless 1 2 3   PHQ - 2 Score 1 4 3   Altered sleeping 1 2 3   Tired, decreased energy 0 2 3  Change in appetite 0 0 3  Feeling bad or failure about yourself  0 1 0  Trouble concentrating 1 2 3   Moving slowly or fidgety/restless 0 0 0  Suicidal thoughts 0 0 0  PHQ-9 Score 3 11 15   Difficult doing work/chores Not difficult at all Somewhat difficult Somewhat difficult       04/19/2023    9:46 AM 02/28/2023    4:21 PM  02/10/2023   10:07 AM 09/06/2022    4:01 PM  GAD 7 : Generalized Anxiety Score  Nervous, Anxious, on Edge 1 1 3 1   Control/stop worrying 1 2 3  0  Worry too much - different things 1 2 3  0  Trouble relaxing 0 2 3 0  Restless 0 1 0 0  Easily annoyed or irritable 1 2 3 1   Afraid - awful might happen 0 0 0 0  Total GAD 7 Score 4 10 15 2   Anxiety Difficulty Somewhat difficult Somewhat difficult Somewhat difficult Not difficult at all    ROS Negative unless otherwise noted in HPI  Outpatient Medications Prior to Visit  Medication Sig   FLUoxetine  (PROZAC ) 40 MG capsule Take 1 capsule (40 mg total) by mouth daily.   levonorgestrel (MIRENA, 52 MG,) 20 MCG/DAY IUD Take 1 device by intrauterine route.   OLANZapine  (ZYPREXA ) 2.5 MG tablet Take 1 tablet (2.5 mg total) by mouth at bedtime.   propranolol  (INDERAL ) 10 MG tablet Take 1 tablet (10 mg total) by mouth 3 (three) times daily as needed.   valACYclovir  (VALTREX ) 500 MG tablet Take 1 tablet (500 mg total) by mouth 2 (two) times daily as needed.   [DISCONTINUED] amphetamine -dextroamphetamine  (ADDERALL) 10 MG tablet Take 1 tablet (10 mg total) by mouth 2 (two) times daily as needed.   [DISCONTINUED] amphetamine -dextroamphetamine  (ADDERALL) 10 MG tablet Take 1  tablet (10 mg total) by mouth 2 (two) times daily as needed.   [DISCONTINUED] amphetamine -dextroamphetamine  (ADDERALL) 10 MG tablet Take 1 tablet (10 mg total) by mouth 2 (two) times daily as needed.   No facility-administered medications prior to visit.     Objective:     Physical Exam General: Speaking clearly in complete sentences without any shortness of breath.  Alert and oriented x3.  Normal judgment. No apparent acute distress.   Assessment & Plan:  Generalized anxiety disorder Assessment & Plan: GAD-7 score improved from 10 to 4.  Continue Prozac  40 mg daily, olanzapine  2.5 mg daily as adjunct.  Continue propranolol  as needed for breakthrough anxiety/panic  attacks.   Depression, major, single episode, moderate (HCC) Assessment & Plan: PHQ-9 score improved from 11 to 3, PHQ 2 score from 4 to 1.  Continue Prozac  40 mg daily, olanzapine  2.5 mg daily as adjunct.  Will continue to monitor.   Adult attention deficit disorder -     Amphetamine -Dextroamphetamine ; Take 1 tablet (10 mg total) by mouth 2 (two) times daily as needed.  Dispense: 60 tablet; Refill: 0    Return in about 2 months (around 06/17/2023) for follow-up for ADHD, mood, in person or video.   I discussed the assessment and treatment plan with the patient. The patient was provided an opportunity to ask questions, and all were answered. The patient agreed with the plan and demonstrated an understanding of the instructions.   The patient was advised to call back or seek an in-person evaluation if the symptoms worsen or if the condition fails to improve as anticipated.  The above assessment and management plan was discussed with the patient. The patient verbalized understanding of and has agreed to the management plan.   Joesph DELENA Sear, PA

## 2023-04-19 NOTE — Assessment & Plan Note (Signed)
GAD-7 score improved from 10 to 4.  Continue Prozac 40 mg daily, olanzapine 2.5 mg daily as adjunct.  Continue propranolol as needed for breakthrough anxiety/panic attacks.

## 2023-04-19 NOTE — Assessment & Plan Note (Signed)
 PHQ-9 score improved from 11 to 3, PHQ 2 score from 4 to 1.  Continue Prozac 40 mg daily, olanzapine 2.5 mg daily as adjunct.  Will continue to monitor.

## 2023-05-26 ENCOUNTER — Encounter: Payer: Self-pay | Admitting: Family Medicine

## 2023-09-26 ENCOUNTER — Telehealth: Payer: Self-pay | Admitting: *Deleted

## 2023-09-26 NOTE — Telephone Encounter (Signed)
 LVM for pt to call office to see about scheduling a follow up visit since the new provider is now here. Please assist her in getting this done.  It should be a in person appointment since her last visit was virtual.

## 2023-10-31 NOTE — Telephone Encounter (Signed)
 LVM for pt to call office to see about scheduling a follow up appt.  Looks like at last visit she was to follow up in February.  Please assist in getting her scheduled with the new provider and it will need to be in person since last visit was by video.

## 2024-01-30 ENCOUNTER — Ambulatory Visit: Payer: Self-pay

## 2024-01-30 NOTE — Telephone Encounter (Signed)
 Per epic note, pt is due for f/u and to establish care with a new PCP. Patient stated she will find a new doctor elsewhere, did not wish to wait until next opening.  Answer Assessment - Initial Assessment Questions 1. REASON FOR CALL: What is the main reason for your call? or How can I best help you?     Patient would like to be tested for mold exposure. Denies symptoms. States she does not know if her house has mold. I just want to see.  Protocols used: Information Only Call - No Triage-A-AH Copied from CRM [phone]. Topic: Clinical - Medical Advice >> Jan 30, 2024 10:19 AM Robinson H wrote: Reason for CRM: Patient would like to know if she can be tested for mold exposure and levels, not sure of symptoms if any.  Ronal [phone]

## 2024-02-01 ENCOUNTER — Encounter (INDEPENDENT_AMBULATORY_CARE_PROVIDER_SITE_OTHER): Payer: Self-pay | Admitting: Gastroenterology

## 2024-02-07 ENCOUNTER — Encounter: Payer: Self-pay | Admitting: Family Medicine

## 2024-02-07 ENCOUNTER — Ambulatory Visit: Admitting: Family Medicine

## 2024-02-07 VITALS — BP 114/76 | HR 63 | Ht 61.0 in | Wt 105.6 lb

## 2024-02-07 DIAGNOSIS — F411 Generalized anxiety disorder: Secondary | ICD-10-CM | POA: Diagnosis not present

## 2024-02-07 DIAGNOSIS — F321 Major depressive disorder, single episode, moderate: Secondary | ICD-10-CM | POA: Diagnosis not present

## 2024-02-07 DIAGNOSIS — Z77120 Contact with and (suspected) exposure to mold (toxic): Secondary | ICD-10-CM | POA: Diagnosis not present

## 2024-02-07 NOTE — Progress Notes (Signed)
 Name: [name]   Date of Visit: 02/07/24   Date of last visit with me: Visit date not found   CHIEF COMPLAINT:  Chief Complaint  Patient presents with   Establish Care    Establish care. Wants a blood and urine test for mole toxicity.        HPI:  Discussed the use of AI scribe software for clinical note transcription with the patient, who gave verbal consent to proceed.  History of Present Illness   Jaice Lague is a 48 year old female who presents with concerns about mold exposure in her home.  She is concerned about potential mold toxicity due to visible mold in her rented home, particularly on wood surfaces and the ceiling in the laundry room. The home is old, and the bedroom was renovated due to mold before she moved in. She is worried about the health implications for herself and her two children, both of whom have asthma.  She experiences itching and is concerned about other potential health effects. She has a history of depression. She has read about testing for mold toxicity through blood and urine tests and is seeking to determine if there is an inflammatory response due to mold exposure.  She has a history of depression and anxiety since middle school. She was previously on Prozac  but is now taking vilazodone for depression. She occasionally uses trazodone  to aid sleep, especially when she has difficulty sleeping, but avoids regular sleeping pills due to concerns about side effects and the need to be alert for her children.  She uses a dehumidifier in her bedroom, which fills up quickly, indicating high moisture levels in the home. She is concerned about the impact of the mold on her children's asthma and her own health.         OBJECTIVE:       02/07/2024    9:18 AM  Depression screen PHQ 2/9  Decreased Interest 0  Down, Depressed, Hopeless 0  PHQ - 2 Score 0  Altered sleeping 0  Tired, decreased energy 3  Change in appetite 0  Feeling bad or  failure about yourself  0  Trouble concentrating 3  Moving slowly or fidgety/restless 0  Suicidal thoughts 0  PHQ-9 Score 6     BP Readings from Last 3 Encounters:  02/07/24 114/76  02/28/23 118/77  09/06/22 118/80    BP 114/76   Pulse 63   Ht 5' 1 (1.549 m)   Wt 105 lb 9.6 oz (47.9 kg)   SpO2 98%   BMI 19.95 kg/m    Physical Exam   GENERAL: No acute distress, well developed, well nourished      Physical Exam Constitutional:      Appearance: Normal appearance.  Neurological:     General: No focal deficit present.     Mental Status: She is alert and oriented to person, place, and time. Mental status is at baseline.     ASSESSMENT/PLAN:   Assessment & Plan Mold exposure  Generalized anxiety disorder  Depression, major, single episode, moderate (HCC)    Assessment and Plan    Suspected mold exposure Visible mold in home causing itching and concern for health impact. Blood and urine tests for mold exposure are unreliable. Skin pinprick tests are more reliable for mold allergies. - Order blood test for mold exposure. - Refer to allergist for evaluation and possible skin pinprick testing.  Depression and anxiety disorder Chronic depression and anxiety since adolescence.  Currently on vilazodone, uses trazodone  for sleep. Avoids propranolol  for social anxiety. History of medication discontinuation and resumption. - Continue vilazodone for depression. - Use trazodone  as needed for sleep.         Rebel Laughridge A. Vita MD [facility]

## 2024-02-13 ENCOUNTER — Telehealth: Payer: Self-pay

## 2024-02-13 NOTE — Telephone Encounter (Signed)
 Copied from CRM [id number]. Topic: Clinical - Lab/Test Results >> Feb 13, 2024  4:05 PM Amy B wrote: Reason for CRM: Patient requests a call back to discuss her lab results [phone]  Informed patient that her lab results have not yet been resulted by [facility] once they get back to us  and Dr. Vita reviews them we will give her a call with them.

## 2024-02-14 LAB — SPECIMEN STATUS REPORT

## 2024-02-14 LAB — SPECIMEN STATUS

## 2024-02-15 ENCOUNTER — Ambulatory Visit: Payer: Self-pay | Admitting: Family Medicine

## 2024-02-15 LAB — FUNGUS CULTURE, BLOOD

## 2024-02-16 ENCOUNTER — Telehealth: Payer: Self-pay

## 2024-02-16 NOTE — Telephone Encounter (Signed)
 Copied from CRM [id number]. Topic: Clinical - Lab/Test Results >> Feb 16, 2024 10:58 AM Tina Ramirez wrote: Reason for CRM: Pt called in stating that she received a message in Mychart stating that her lab results were cancelled. Pt is unsure of what this means. Contacted [facility] and was told to send message. Informed pt that someone will connect with her soon.

## 2024-03-14 ENCOUNTER — Encounter: Payer: Self-pay | Admitting: Family Medicine

## 2024-03-22 ENCOUNTER — Encounter: Payer: Self-pay | Admitting: Family Medicine
